# Patient Record
Sex: Male | Born: 1945 | Race: White | Hispanic: No | Marital: Married | Smoking: Former smoker
Health system: Southern US, Community
[De-identification: ages and names within clinical notes are randomized; demographics above are authoritative.]

## PROBLEM LIST (undated history)

## (undated) DIAGNOSIS — E781 Pure hyperglyceridemia: Secondary | ICD-10-CM

## (undated) DIAGNOSIS — M1990 Unspecified osteoarthritis, unspecified site: Secondary | ICD-10-CM

## (undated) DIAGNOSIS — K219 Gastro-esophageal reflux disease without esophagitis: Secondary | ICD-10-CM

## (undated) DIAGNOSIS — I10 Essential (primary) hypertension: Secondary | ICD-10-CM

## (undated) DIAGNOSIS — Z8489 Family history of other specified conditions: Secondary | ICD-10-CM

## (undated) HISTORY — DX: Essential (primary) hypertension: I10

## (undated) HISTORY — DX: Pure hyperglyceridemia: E78.1

## (undated) HISTORY — PX: POPLITEAL SYNOVIAL CYST EXCISION: SUR555

## (undated) HISTORY — PX: CARPAL TUNNEL RELEASE: SHX101

## (undated) HISTORY — DX: Unspecified osteoarthritis, unspecified site: M19.90

---

## 2001-12-09 ENCOUNTER — Ambulatory Visit (HOSPITAL_COMMUNITY): Admission: RE | Admit: 2001-12-09 | Discharge: 2001-12-09 | Payer: Self-pay | Admitting: Family Medicine

## 2001-12-09 ENCOUNTER — Encounter: Payer: Self-pay | Admitting: Family Medicine

## 2009-05-13 ENCOUNTER — Emergency Department (HOSPITAL_COMMUNITY): Admission: EM | Admit: 2009-05-13 | Discharge: 2009-05-13 | Payer: Self-pay | Admitting: Emergency Medicine

## 2009-05-13 ENCOUNTER — Emergency Department (HOSPITAL_COMMUNITY): Admission: EM | Admit: 2009-05-13 | Discharge: 2009-05-14 | Payer: Self-pay | Admitting: Emergency Medicine

## 2009-05-24 ENCOUNTER — Ambulatory Visit (HOSPITAL_COMMUNITY): Admission: RE | Admit: 2009-05-24 | Discharge: 2009-05-24 | Payer: Self-pay | Admitting: Urology

## 2009-05-25 HISTORY — PX: LITHOTRIPSY: SUR834

## 2009-06-08 ENCOUNTER — Ambulatory Visit (HOSPITAL_COMMUNITY): Admission: RE | Admit: 2009-06-08 | Discharge: 2009-06-08 | Payer: Self-pay | Admitting: Urology

## 2009-06-09 ENCOUNTER — Emergency Department (HOSPITAL_COMMUNITY): Admission: EM | Admit: 2009-06-09 | Discharge: 2009-06-10 | Payer: Self-pay | Admitting: Emergency Medicine

## 2010-09-25 LAB — DIFFERENTIAL
Basophils Absolute: 0 10*3/uL (ref 0.0–0.1)
Basophils Relative: 0 % (ref 0–1)
Eosinophils Absolute: 0 10*3/uL (ref 0.0–0.7)
Eosinophils Relative: 0 % (ref 0–5)
Lymphocytes Relative: 8 % — ABNORMAL LOW (ref 12–46)
Monocytes Absolute: 0.5 10*3/uL (ref 0.1–1.0)

## 2010-09-25 LAB — URINALYSIS, ROUTINE W REFLEX MICROSCOPIC
Bilirubin Urine: NEGATIVE
Ketones, ur: 15 mg/dL — AB
Leukocytes, UA: NEGATIVE
Nitrite: NEGATIVE
Protein, ur: NEGATIVE mg/dL
Urobilinogen, UA: 0.2 mg/dL (ref 0.0–1.0)

## 2010-09-25 LAB — BASIC METABOLIC PANEL
CO2: 24 mEq/L (ref 19–32)
Calcium: 10.2 mg/dL (ref 8.4–10.5)
GFR calc non Af Amer: 57 mL/min — ABNORMAL LOW (ref >60)
Potassium: 3.6 mEq/L (ref 3.5–5.1)
Sodium: 132 mEq/L — ABNORMAL LOW (ref 135–145)

## 2010-09-25 LAB — URINE CULTURE
Colony Count: NO GROWTH
Culture: NO GROWTH

## 2010-09-25 LAB — URINE MICROSCOPIC-ADD ON

## 2010-09-25 LAB — CBC
RBC: 5.35 MIL/uL (ref 4.22–5.81)
WBC: 12.4 10*3/uL — ABNORMAL HIGH (ref 4.0–10.5)

## 2010-09-26 LAB — BASIC METABOLIC PANEL
Calcium: 10.3 mg/dL (ref 8.4–10.5)
GFR calc non Af Amer: >60 mL/min (ref >60)
Glucose, Bld: 99 mg/dL (ref 70–99)
Potassium: 3.8 mEq/L (ref 3.5–5.1)
Sodium: 139 mEq/L (ref 135–145)

## 2010-09-27 LAB — URINE CULTURE
Colony Count: NO GROWTH
Culture: NO GROWTH

## 2010-09-27 LAB — URINALYSIS, ROUTINE W REFLEX MICROSCOPIC
Bilirubin Urine: NEGATIVE
Glucose, UA: NEGATIVE mg/dL
Ketones, ur: 40 mg/dL — AB
Leukocytes, UA: NEGATIVE
Nitrite: NEGATIVE
Protein, ur: 30 mg/dL — AB
Specific Gravity, Urine: 1.015 (ref 1.005–1.030)
Urobilinogen, UA: 0.2 mg/dL (ref 0.0–1.0)
pH: 6.5 (ref 5.0–8.0)

## 2010-09-27 LAB — BASIC METABOLIC PANEL
CO2: 23 mEq/L (ref 19–32)
Chloride: 101 mEq/L (ref 96–112)
Potassium: 3.6 mEq/L (ref 3.5–5.1)

## 2010-09-27 LAB — BASIC METABOLIC PANEL WITH GFR
BUN: 15 mg/dL (ref 6–23)
Calcium: 9.9 mg/dL (ref 8.4–10.5)
Creatinine, Ser: 1.46 mg/dL (ref 0.4–1.5)
GFR calc non Af Amer: 49 mL/min — ABNORMAL LOW (ref >60)
Glucose, Bld: 170 mg/dL — ABNORMAL HIGH (ref 70–99)
Sodium: 138 meq/L (ref 135–145)

## 2010-09-27 LAB — URINE MICROSCOPIC-ADD ON

## 2010-12-16 ENCOUNTER — Emergency Department (HOSPITAL_COMMUNITY): Payer: BC Managed Care – PPO

## 2010-12-16 ENCOUNTER — Emergency Department (HOSPITAL_COMMUNITY)
Admission: EM | Admit: 2010-12-16 | Discharge: 2010-12-16 | Disposition: A | Discharge status: Home / Self Care | Payer: BC Managed Care – PPO | Attending: Emergency Medicine | Admitting: Emergency Medicine

## 2010-12-16 DIAGNOSIS — I10 Essential (primary) hypertension: Secondary | ICD-10-CM | POA: Insufficient documentation

## 2010-12-16 DIAGNOSIS — Z79899 Other long term (current) drug therapy: Secondary | ICD-10-CM | POA: Insufficient documentation

## 2010-12-16 DIAGNOSIS — K922 Gastrointestinal hemorrhage, unspecified: Secondary | ICD-10-CM | POA: Insufficient documentation

## 2010-12-16 DIAGNOSIS — K219 Gastro-esophageal reflux disease without esophagitis: Secondary | ICD-10-CM | POA: Insufficient documentation

## 2010-12-16 DIAGNOSIS — K649 Unspecified hemorrhoids: Secondary | ICD-10-CM | POA: Insufficient documentation

## 2010-12-16 LAB — CBC
HCT: 44 % (ref 39.0–52.0)
Hemoglobin: 15.8 g/dL (ref 13.0–17.0)
MCV: 89.6 fL (ref 78.0–100.0)
Platelets: 181 10*3/uL (ref 150–400)
RBC: 4.91 MIL/uL (ref 4.22–5.81)
WBC: 6.3 10*3/uL (ref 4.0–10.5)

## 2010-12-16 LAB — HEPATIC FUNCTION PANEL
AST: 25 U/L (ref 0–37)
Albumin: 4.1 g/dL (ref 3.5–5.2)
Alkaline Phosphatase: 52 U/L (ref 39–117)
Total Protein: 7.2 g/dL (ref 6.0–8.3)

## 2010-12-16 LAB — BASIC METABOLIC PANEL
BUN: 15 mg/dL (ref 6–23)
CO2: 28 mEq/L (ref 19–32)
Chloride: 101 mEq/L (ref 96–112)
Creatinine, Ser: 1.22 mg/dL (ref 0.50–1.35)
Glucose, Bld: 106 mg/dL — ABNORMAL HIGH (ref 70–99)

## 2010-12-16 LAB — DIFFERENTIAL
Lymphocytes Relative: 23 % (ref 12–46)
Lymphs Abs: 1.4 10*3/uL (ref 0.7–4.0)
Monocytes Relative: 10 % (ref 3–12)
Neutro Abs: 4.1 10*3/uL (ref 1.7–7.7)
Neutrophils Relative %: 65 % (ref 43–77)

## 2010-12-16 LAB — PROTIME-INR: Prothrombin Time: 13.4 seconds (ref 11.6–15.2)

## 2010-12-16 LAB — LIPASE, BLOOD: Lipase: 36 U/L (ref 11–59)

## 2010-12-16 MED ORDER — IOHEXOL 350 MG/ML SOLN: 100.0000 mL | Freq: Once | INTRAVENOUS | Status: DC | PRN

## 2010-12-16 MED ORDER — IOHEXOL 300 MG/ML  SOLN: 100.0000 mL | Freq: Once | INTRAMUSCULAR | Status: DC | PRN

## 2010-12-28 NOTE — H&P (Addendum)
Adam Lewis is an 65 y.o. male.   Chief Complaint: *Need for screening TCS** HPI: *Had one episode of blood per rectum.  Seen in ER, felt to be hemorrhoidal disease.  Has since resolved.  Presents for screening TCS.  Never has had a TCS.**  No past medical history on file.HTN, reflux  No past surgical history on file.Left knee surgery  No family history on file. Social History:  does not have a smoking history on file. He does not have any smokeless tobacco history on file. His alcohol and drug histories not on file.  Allergies: Allergies not on fileNKDA  No prescriptions prior to admission  Prevacid, Lisinopril/HCTZ, baby asa, meloxicam  No results found for this or any previous visit (from the past 48 hour(s)). No results found.  Review of Systems  Constitutional: Negative.   HENT: Negative.   Eyes: Negative.   Respiratory: Negative.   Cardiovascular: Negative.   Gastrointestinal: Positive for blood in stool.  Genitourinary: Negative.   Musculoskeletal: Negative.   Skin: Negative.   Neurological: Negative.   Endo/Heme/Allergies: Negative.   Psychiatric/Behavioral: Negative.     There were no vitals taken for this visit. Physical Exam  Constitutional: He is oriented to person, place, and time. He appears well-developed and well-nourished.  HENT:  Head: Normocephalic and atraumatic.  Eyes: Pupils are equal, round, and reactive to light.  Neck: Normal range of motion. Neck supple.  Cardiovascular: Normal rate, regular rhythm and normal heart sounds.   Respiratory: Effort normal and breath sounds normal. He has no wheezes. He has no rales.  GI: Soft. Bowel sounds are normal. He exhibits no distension and no mass. There is no tenderness.  Musculoskeletal: Normal range of motion.  Lymphadenopathy:    He has no cervical adenopathy.  Neurological: He is alert and oriented to person, place, and time.  Skin: Skin is warm.  Psychiatric: He has a normal mood and affect.  His behavior is normal. Judgment and thought content normal.     Assessment/Plan *Need for screening TCS.  Scheduled for TCS on 01/02/11.**  Autumm Hattery A 12/28/2010, 1:02 PM

## 2011-01-01 MED ORDER — SODIUM CHLORIDE 0.45 % IV SOLN
Freq: Once | INTRAVENOUS | Status: AC
  Administered 2011-01-02: 07:00:00 via INTRAVENOUS

## 2011-01-02 ENCOUNTER — Encounter (HOSPITAL_COMMUNITY)
Admission: RE | Disposition: A | Discharge status: Home / Self Care | Payer: Self-pay | Source: Ambulatory Visit | Attending: General Surgery

## 2011-01-02 ENCOUNTER — Encounter (HOSPITAL_COMMUNITY): Payer: Self-pay | Admitting: *Deleted

## 2011-01-02 ENCOUNTER — Ambulatory Visit (HOSPITAL_COMMUNITY)
Admission: RE | Admit: 2011-01-02 | Discharge: 2011-01-02 | Disposition: A | Discharge status: Home / Self Care | Payer: BC Managed Care – PPO | Source: Ambulatory Visit | Attending: General Surgery | Admitting: General Surgery

## 2011-01-02 DIAGNOSIS — I10 Essential (primary) hypertension: Secondary | ICD-10-CM | POA: Insufficient documentation

## 2011-01-02 DIAGNOSIS — Z79899 Other long term (current) drug therapy: Secondary | ICD-10-CM | POA: Insufficient documentation

## 2011-01-02 DIAGNOSIS — Z1211 Encounter for screening for malignant neoplasm of colon: Secondary | ICD-10-CM | POA: Insufficient documentation

## 2011-01-02 DIAGNOSIS — Z7982 Long term (current) use of aspirin: Secondary | ICD-10-CM | POA: Insufficient documentation

## 2011-01-02 HISTORY — DX: Gastro-esophageal reflux disease without esophagitis: K21.9

## 2011-01-02 HISTORY — PX: COLONOSCOPY: SHX5424

## 2011-01-02 SURGERY — COLONOSCOPY
Anesthesia: Moderate Sedation

## 2011-01-02 MED ORDER — MEPERIDINE HCL 100 MG/ML IJ SOLN
INTRAMUSCULAR | Status: AC
  Filled 2011-01-02: qty 2

## 2011-01-02 MED ORDER — MIDAZOLAM HCL 5 MG/5ML IJ SOLN
INTRAMUSCULAR | Status: DC | PRN
  Administered 2011-01-02: 3 mg via INTRAVENOUS
  Administered 2011-01-02: 1 mg via INTRAVENOUS

## 2011-01-02 MED ORDER — MEPERIDINE HCL 25 MG/ML IJ SOLN
INTRAMUSCULAR | Status: DC | PRN
  Administered 2011-01-02: 50 mg via INTRAVENOUS

## 2011-01-02 MED ORDER — STERILE WATER FOR IRRIGATION IR SOLN
Status: DC | PRN
  Administered 2011-01-02: 08:00:00

## 2011-01-02 MED ORDER — MIDAZOLAM HCL 5 MG/5ML IJ SOLN
INTRAMUSCULAR | Status: AC
  Filled 2011-01-02: qty 10

## 2011-01-03 NOTE — Brief Op Note (Deleted)
Refer to procedures note. 

## 2011-01-03 NOTE — Op Note (Signed)
Refer to procedures note.

## 2011-01-04 ENCOUNTER — Encounter (HOSPITAL_COMMUNITY): Payer: Self-pay

## 2011-01-15 ENCOUNTER — Encounter (HOSPITAL_COMMUNITY): Payer: Self-pay | Admitting: General Surgery

## 2011-12-26 ENCOUNTER — Other Ambulatory Visit (HOSPITAL_COMMUNITY): Payer: Self-pay | Admitting: Family Medicine

## 2011-12-26 ENCOUNTER — Ambulatory Visit (HOSPITAL_COMMUNITY)
Admission: RE | Admit: 2011-12-26 | Discharge: 2011-12-26 | Disposition: A | Discharge status: Home / Self Care | Payer: Medicare Other | Source: Ambulatory Visit | Attending: Family Medicine | Admitting: Family Medicine

## 2011-12-26 DIAGNOSIS — M79609 Pain in unspecified limb: Secondary | ICD-10-CM

## 2012-10-28 ENCOUNTER — Ambulatory Visit (HOSPITAL_COMMUNITY)
Admission: RE | Admit: 2012-10-28 | Discharge: 2012-10-28 | Disposition: A | Discharge status: Home / Self Care | Payer: Medicare Other | Source: Ambulatory Visit | Attending: Family Medicine | Admitting: Family Medicine

## 2012-10-28 ENCOUNTER — Other Ambulatory Visit (HOSPITAL_COMMUNITY): Payer: Self-pay | Admitting: Family Medicine

## 2012-10-28 DIAGNOSIS — R05 Cough: Secondary | ICD-10-CM

## 2012-10-28 DIAGNOSIS — R059 Cough, unspecified: Secondary | ICD-10-CM | POA: Insufficient documentation

## 2013-05-26 ENCOUNTER — Ambulatory Visit (HOSPITAL_COMMUNITY)
Admission: RE | Admit: 2013-05-26 | Discharge: 2013-05-26 | Disposition: A | Discharge status: Home / Self Care | Payer: Medicare Other | Source: Ambulatory Visit | Attending: Orthopaedic Surgery | Admitting: Orthopaedic Surgery

## 2013-05-26 DIAGNOSIS — M25561 Pain in right knee: Secondary | ICD-10-CM

## 2013-05-26 DIAGNOSIS — M25661 Stiffness of right knee, not elsewhere classified: Secondary | ICD-10-CM

## 2013-05-26 DIAGNOSIS — M25569 Pain in unspecified knee: Secondary | ICD-10-CM | POA: Insufficient documentation

## 2013-05-26 DIAGNOSIS — I10 Essential (primary) hypertension: Secondary | ICD-10-CM | POA: Insufficient documentation

## 2013-05-26 DIAGNOSIS — IMO0001 Reserved for inherently not codable concepts without codable children: Secondary | ICD-10-CM | POA: Insufficient documentation

## 2013-05-26 DIAGNOSIS — M25669 Stiffness of unspecified knee, not elsewhere classified: Secondary | ICD-10-CM | POA: Insufficient documentation

## 2013-05-26 NOTE — Evaluation (Signed)
Physical Therapy Evaluation  Patient Details  Name: Adam Lewis MRN: [id number] Date of Birth: 1945/06/26  Today's Date: 05/26/2013 Time: 1325-1355 PT Time Calculation (min): 30 min Charges: 1 evaluation             Visit#: 1 of 6  Re-eval:   Assessment Diagnosis: Rt knee scope Next MD Visit: Dr. Jerl Santos  Authorization: Medicare    Authorization Time Period:    Authorization Visit#: 1 of 10   Past Medical History:  Past Medical History  Diagnosis Date  . Hypertension   . GERD (gastroesophageal reflux disease)   . High triglycerides    Past Surgical History:  Past Surgical History  Procedure Laterality Date  . Popliteal synovial cyst excision  30 years ago  . Lithotripsy  05/2009  . Colonoscopy  01/02/2011    Procedure: COLONOSCOPY;  Surgeon: Dalia Heading;  [facility];  Service: Gastroenterology;  Laterality: N/A;    Subjective Symptoms/Limitations Pertinent History: Pt is a 67 year old male referred to PT s/p Rt knee scope 5 weeks ago of idiopathic nature which flaired up in the summer time.  his c/co is pain and tenderness to his medial knee.  He thought his knee should have been feeling better by now.  He is having a hard time sleeping on his side.  He has difficulty walking for any amount of time.   Limitations: Standing;Walking;House hold activities How long can you walk comfortably?: 30 minutes  Pain Assessment Currently in Pain?: Yes Pain Score: 3  Pain Location: Knee (achy) Pain Orientation: Left Pain Type: Acute pain;Surgical pain  Balance Screening Balance Screen Has the patient fallen in the past 6 months: No Has the patient had a decrease in activity level because of a fear of falling? : Yes Is the patient reluctant to leave their home because of a fear of falling? : No  Prior Functioning  Prior Function Level of Independence: Independent with basic ADLs  Able to Take Stairs?: Reciprically Driving: Yes Vocation: Part time  employment Vocation Requirements: substitute teacher, used to be an IT  Comments: walking for exercise (1/2-1 mile everyday)  Cognition/Observation Observation/Other Assessments Observations: swelling to Rt knee  Sensation/Coordination/Flexibility/Functional Tests Functional Tests Functional Tests: FOTO: 45/55  Assessment RLE AROM (degrees) Right Knee Extension: 8 Right Knee Flexion: 120 RLE Strength Right Hip Flexion: 5/5 Right Hip Extension: 5/5 Right Hip ABduction: 5/5 Right Hip ADduction: 5/5 Right Knee Flexion: 5/5 Right Knee Extension: 4/5 (painful) Palpation Palpation: Rt knee spasm to popliteal muscle belly  Mobility/Balance  Ambulation/Gait Ambulation/Gait: Yes Gait Pattern: Antalgic   Exercise/Treatments Stretches Gastroc Stretch: 30 seconds;1 rep Supine Quad Sets: Right;5 reps;Limitations Quad Sets Limitations: 10 sec holds Short Arc Quad Sets: Right;5 reps Prone  Hamstring Curl: 5 reps Prone Knee Hang: 2 minutes Other Prone Exercises: TKE 5 reps  Physical Therapy Assessment and Plan PT Assessment and Plan Clinical Impression Statement: Pt is a 67 year old male referred to PT s/p Rt knee scope with impairments listed below.  After evaluation pt is most limited by signficant muscle spasms and fascial restrictions to Rt popliteal region likely causing dysfunction to his Rt knee full extension and impaired gait mechanics causing increased pain.  Pt will benefit from skilled therapeutic intervention in order to improve on the following deficits: Abnormal gait;Pain;Decreased range of motion;Impaired perceived functional ability;Increased edema Rehab Potential: Good PT Frequency: Min 2X/week PT Duration:  (3 weeks) PT Treatment/Interventions: Gait training;Stair training;Functional mobility training;Therapeutic activities;Therapeutic exercise;Balance training;Modalities;Manual techniques;Patient/family education  PT Plan: Continue with manual technqiues to  anterior and posterior knee to decrease pain.  Continue to progress open chain and move to closed chain activities as tolerated.     Goals Home Exercise Program Pt/caregiver will Perform Home Exercise Program: Independently PT Goal: Perform Home Exercise Program - Progress: Met PT Short Term Goals Time to Complete Short Term Goals: 3 weeks PT Short Term Goal 1: Pt will present with decreased fascial restrictions and muscle spasms in order to report pain less than a 3/10 at night time for improved QOL>  PT Short Term Goal 2: Pt will improve knee AROM 0-120 degrees for improved gait mechanics.  PT Short Term Goal 3: Pt will be educated on self care techniques to decrease knee pain.  PT Short Term Goal 4: Pt will improve his FOTO score to limiation less than 43%.  Problem List Patient Active Problem List   Diagnosis Date Noted  . Pain in right knee 05/26/2013  . Stiffness of right knee 05/26/2013    PT - End of Session Activity Tolerance: Patient tolerated treatment well General Behavior During Therapy: [facility] for tasks assessed/performed PT Plan of Care PT Home Exercise Plan: given PT Patient Instructions: importance of HEP and use of ice for pain control.  Consulted and Agree with Plan of Care: Patient  GP Functional Assessment Tool Used: foto: 45/55 Functional Limitation: Mobility: Walking and moving around Mobility: Walking and Moving Around Current Status (W0981): At least 40 percent but less than 60 percent impaired, limited or restricted Mobility: Walking and Moving Around Goal Status [id number]): At least 40 percent but less than 60 percent impaired, limited or restricted  Ova Meegan, MPT, ATC 05/26/2013, 4:20 PM  Physician Documentation Your signature is required to indicate approval of the treatment plan as stated above.  Please sign and either send electronically or make a copy of this report for your files and return this physician signed original.   Please mark one  1.__approve of plan  2. ___approve of plan with the following conditions.   ______________________________                                                          _____________________ Physician Signature                                                                                                             Date

## 2013-05-28 ENCOUNTER — Ambulatory Visit (HOSPITAL_COMMUNITY)
Admission: RE | Admit: 2013-05-28 | Discharge: 2013-05-28 | Disposition: A | Discharge status: Home / Self Care | Payer: Medicare Other | Source: Ambulatory Visit | Attending: *Deleted | Admitting: *Deleted

## 2013-05-28 NOTE — Progress Notes (Signed)
Physical Therapy Treatment Patient Details  Name: Adam Lewis MRN: [id number] Date of Birth: 1946-01-18  Today's Date: 05/28/2013 Time: 1651-1730 PT Time Calculation (min): 39 min Charges: Therex x 28'(1651-1719) Manual x 10'(1720-1730)  Visit#: 2 of 6   Authorization: Medicare  Authorization Visit#: 2 of 10   Subjective: Symptoms/Limitations Symptoms: Pt states that after working today his knee pain increased to 4/10. It decreased with pain meds. Pain Assessment Currently in Pain?: Yes Pain Score: 2  Pain Location: Knee Pain Orientation: Left  Exercise/Treatments Stretches Gastroc Stretch: 2 reps;30 seconds;Limitations Gastroc Stretch Limitations: Slant board Supine Quad Sets: 10 reps Quad Sets Limitations: 10" holds Short Arc [employer] Sets: 10 reps;Limitations Short Arc Quad Sets Limitations: 5" holds Terminal Knee Extension: 10 reps;Limitations Terminal Knee Extension Limitations: 5" holds Straight Leg Raises: 10 reps  Manual Therapy Manual Therapy: Myofascial release Myofascial Release: MFR to right medial and lateral knee to decrease adhesions and pain.  Physical Therapy Assessment and Plan PT Assessment and Plan Clinical Impression Statement: Treatments focus on improving quad strength/coordination and improve active extension. Pt requires multimodal cueing to improved distal quad coordination. Encouraged pt to complete quad sets at home as often as possible. manual techniques completed to medial and lateral knee to decrease adhesions and pain. Pt reports pain decrease to 1/10 at end of session. Advised pt to ice knee at home. Pt will benefit from skilled therapeutic intervention in order to improve on the following deficits: Abnormal gait;Pain;Decreased range of motion;Impaired perceived functional ability;Increased edema Rehab Potential: Good PT Frequency: Min 2X/week PT Duration:  (3 weeks) PT Treatment/Interventions: Gait training;Stair training;Functional  mobility training;Therapeutic activities;Therapeutic exercise;Balance training;Modalities;Manual techniques;Patient/family education PT Plan: Continue with manual technqiues to anterior and posterior knee to decrease pain.  Continue to progress open chain and move to closed chain activities as tolerated.     Problem List Patient Active Problem List   Diagnosis Date Noted  . Pain in right knee 05/26/2013  . Stiffness of right knee 05/26/2013    PT - End of Session Activity Tolerance: Patient tolerated treatment well General Behavior During Therapy: [facility] for tasks assessed/performed  Seth Bake, PTA  05/28/2013, 5:36 PM

## 2013-06-02 ENCOUNTER — Ambulatory Visit (HOSPITAL_COMMUNITY)
Admission: RE | Admit: 2013-06-02 | Discharge: 2013-06-02 | Disposition: A | Discharge status: Home / Self Care | Payer: Medicare Other | Source: Ambulatory Visit | Attending: *Deleted | Admitting: *Deleted

## 2013-06-02 NOTE — Progress Notes (Signed)
Physical Therapy Treatment Patient Details  Name: Adam Lewis MRN: [id number] Date of Birth: 07-Mar-1946  Today's Date: 06/02/2013 Time: 7829-5621 PT Time Calculation (min): 50 min Charges: Therex x 38'(1644-1722) Ice x 10'(1734-1734)  Visit#: 3 of 6   Authorization: Medicare  Authorization Visit#: 3 of 10   Subjective: Symptoms/Limitations Symptoms: Pt feels like he is walking better. Pain Assessment Currently in Pain?: Yes Pain Score: 2  Pain Location: Knee Pain Orientation: Right  Exercise/Treatments Stretches Gastroc Stretch: 2 reps;30 seconds;Limitations Gastroc Stretch Limitations: Slant board Standing Heel Raises: 10 reps;Limitations Heel Raises Limitations: Toe raises x 10 Knee Flexion: 10 reps Functional Squat: 10 reps Rocker Board: 2 minutes Supine Short Arc Quad Sets: 10 reps;Limitations Short Arc Quad Sets Limitations: 5" holds  Physical Therapy Assessment and Plan PT Assessment and Plan Clinical Impression Statement: Progressed to standing exercises with minimal difficulty after initial cueing and demo. Pt continues to requires multimodal cueing to improve distal quad contraction. Ice applied at end of session to limit pain and inflammation. Pt will benefit from skilled therapeutic intervention in order to improve on the following deficits: Abnormal gait;Pain;Decreased range of motion;Impaired perceived functional ability;Increased edema Rehab Potential: Good PT Plan: Continue to progress strength/stability per PT POC. Utilize manual techniques and ice as needed.     Problem List Patient Active Problem List   Diagnosis Date Noted  . Pain in right knee 05/26/2013  . Stiffness of right knee 05/26/2013    PT - End of Session Activity Tolerance: Patient tolerated treatment well General Behavior During Therapy: [facility] for tasks assessed/performed  Seth Bake, PTA 06/02/2013, 5:28 PM

## 2013-06-04 ENCOUNTER — Ambulatory Visit (HOSPITAL_COMMUNITY)
Admission: RE | Admit: 2013-06-04 | Discharge: 2013-06-04 | Disposition: A | Discharge status: Home / Self Care | Payer: Medicare Other | Source: Ambulatory Visit | Attending: *Deleted | Admitting: *Deleted

## 2013-06-04 NOTE — Progress Notes (Signed)
Physical Therapy Treatment Patient Details  Name: Adam Lewis MRN: [id number] Date of Birth: [birth year]  Today's Date: 06/04/2013 Time: 3086-5784 PT Time Calculation (min): 40 min Charges: Therex x [phone]) Ultrasound x 8'(1717-1725)  Visit#: 3 of 6   Authorization: Medicare  Authorization Visit#: 3 of 10   Subjective: Symptoms/Limitations Symptoms: Pt states that she has had increased pain since last session. Pain Assessment Currently in Pain?: Yes Pain Score: 3  Pain Location: Knee Pain Orientation: Right   Exercise/Treatments Aerobic Stationary Bike: 6'@2 .0 Supine Quad Sets: 10 reps Quad Sets Limitations: 10" holds Short Arc [name]: 10 reps Short Arc Quad Sets Limitations: 5" holds Terminal Knee Extension: 10 reps Terminal Knee Extension Limitations: 5" holds  Modalities Modalities: Ultrasound Ultrasound Ultrasound Location: Right medial knee Ultrasound Parameters: 3 MHz 1.0 w/cm2 50% duty cycle Ultrasound Goals: Pain  Physical Therapy Assessment and Plan PT Assessment and Plan Clinical Impression Statement: Standing exercises held this session secondary to increased pain. Pt displays improve distal quad contraction with TKE and quad sets. Completed non-thermal ultrasound to right medial knee to decrease pain and facilitates healing. Pt reports pain decrease to 1/10 at end of session. Pt will benefit from skilled therapeutic intervention in order to improve on the following deficits: Abnormal gait;Pain;Decreased range of motion;Impaired perceived functional ability;Increased edema Rehab Potential: Good PT Plan: Continue to progress strength/stability per PT POC. Utilize manual techniques and modalities as needed.    Problem List Patient Active Problem List   Diagnosis Date Noted  . Pain in right knee 05/26/2013  . Stiffness of right knee 05/26/2013    PT - End of Session Activity Tolerance: Patient tolerated treatment  well General Behavior During Therapy: [facility] for tasks assessed/performed  Seth Bake, PTA  06/04/2013, 5:51 PM

## 2013-06-09 ENCOUNTER — Ambulatory Visit (HOSPITAL_COMMUNITY)
Admission: RE | Admit: 2013-06-09 | Discharge: 2013-06-09 | Disposition: A | Discharge status: Home / Self Care | Payer: Medicare Other | Source: Ambulatory Visit | Attending: *Deleted | Admitting: *Deleted

## 2013-06-09 DIAGNOSIS — M25661 Stiffness of right knee, not elsewhere classified: Secondary | ICD-10-CM

## 2013-06-09 DIAGNOSIS — M25561 Pain in right knee: Secondary | ICD-10-CM

## 2013-06-09 NOTE — Progress Notes (Signed)
Physical Therapy Treatment Patient Details  Name: Adam Lewis MRN: [id number] Date of Birth: 1946/04/15  Today's Date: 06/09/2013 Time: 1478-2956 PT Time Calculation (min): 57 min Charge TE [id number], [place] 8469-6295  Visit#: 4 of 6  Re-eval:   Assessment Diagnosis: Rt knee scope Next MD Visit: Dr. Jerl Santos  Authorization: Medicare  Authorization Time Period:    Authorization Visit#: 4 of 10   Subjective: Symptoms/Limitations Symptoms: Pt states he has been hurting since last Thursday, today first day of pain.  Pt stated most difficulty with stairs, kneeling and knee extenstion How long can you sit comfortably?: 5-10 minutes Pain Assessment Currently in Pain?: Yes Pain Score: 1  Pain Location: Knee Pain Orientation: Right  Objective:   Exercise/Treatments Stretches Active Hamstring Stretch: 2 reps;30 seconds;Limitations Active Hamstring Stretch Limitations: with rope Gastroc Stretch: 3 reps;30 seconds;Limitations Gastroc Stretch Limitations: Slant board Aerobic Stationary Bike: 8' '@2 .0 Standing Heel Raises: 10 reps;Limitations Heel Raises Limitations: Toe raises x 10 Knee Flexion: 10 reps Terminal Knee Extension: Right;10 reps;Theraband Theraband Level (Terminal Knee Extension): Level 4 (Blue) Lateral Step Up: Right;10 reps;Hand Hold: 2;Step Height: 4" Forward Step Up: Right;10 reps;Hand Hold: 1;Step Height: 6" Rocker Board: 2 minutes;Limitations Rocker Board Limitations: R/L and A/P Supine Quad Sets: 10 reps Quad Sets Limitations: 10" holds Short Arc [name]: 10 reps Short Arc [name] Limitations: 5" holds Terminal Knee Extension: 10 reps Terminal Knee Extension Limitations: 5" holds Prone  Prone Knee Hang: Limitations Prone Knee Hang Limitations: 8' during [place] to hamstring insertion points   Modalities Modalities: Ultrasound Manual Therapy Manual Therapy: Myofascial release Ultrasound Ultrasound Location: Right medial knee Ultrasound  Parameters: 3 MHz 1.0 w/cm2 50% duty cycle Ultrasound Goals: Pain  Physical Therapy Assessment and Plan PT Assessment and Plan Clinical Impression Statement: Session focus on improving functional difficults pt c/o and improve knee extension.  Pt able to demonstrate appropriate technique with all exercises following min cueing for technique.  Completed US in prone knee hang position to hamstring insertion points to decrease tightness and increase knee extension.  Pt explained benefits of increaseing HEP frequency for ultimate benefits.  Pt with improve knee extension to 4 degrees extension at end of session.   PT Plan: Continue to progress strength/stability per PT POC. Utilize manual techniques and modalities as needed.    Goals Home Exercise Program Pt/caregiver will Perform Home Exercise Program: Independently PT Short Term Goals Time to Complete Short Term Goals: 3 weeks PT Short Term Goal 1: Pt will present with decreased fascial restrictions and muscle spasms in order to report pain less than a 3/10 at night time for improved QOL>  PT Short Term Goal 2: Pt will improve knee AROM 0-120 degrees for improved gait mechanics.  PT Short Term Goal 2 - Progress: Progressing toward goal PT Short Term Goal 3: Pt will be educated on self care techniques to decrease knee pain.  PT Short Term Goal 3 - Progress: Progressing toward goal PT Short Term Goal 4: Pt will improve his FOTO score to limiation less than 43%.  Problem List Patient Active Problem List   Diagnosis Date Noted  . Pain in right knee 05/26/2013  . Stiffness of right knee 05/26/2013    PT - End of Session Activity Tolerance: Patient tolerated treatment well General Behavior During Therapy: [facility] for tasks assessed/performed  GP    Adam Lewis 06/09/2013, 5:58 PM

## 2013-06-11 ENCOUNTER — Ambulatory Visit (HOSPITAL_COMMUNITY)
Admission: RE | Admit: 2013-06-11 | Discharge: 2013-06-11 | Disposition: A | Discharge status: Home / Self Care | Payer: Medicare Other | Source: Ambulatory Visit | Attending: *Deleted | Admitting: *Deleted

## 2013-06-11 DIAGNOSIS — M25561 Pain in right knee: Secondary | ICD-10-CM

## 2013-06-11 DIAGNOSIS — M25661 Stiffness of right knee, not elsewhere classified: Secondary | ICD-10-CM

## 2013-06-11 NOTE — Evaluation (Signed)
Physical Therapy Discharge Summary  Patient Details  Name: Adam Lewis MRN: [id number] Date of Birth: December 20, 1945  Today's Date: 06/11/2013 Time: 0865-7846 PT Time Calculation (min): 46 min Charges: MMT/ ROMM x 9(6295-2841) Self care x 18' (1657-1715)              Visit#: 5 of 6  Assessment Diagnosis: Rt knee scope Next MD Visit: Dr. Jerl Santos  Authorization: Medicare    Authorization Visit#: 5 of 10   Past Medical History:  Past Medical History  Diagnosis Date  . Hypertension   . GERD (gastroesophageal reflux disease)   . High triglycerides    Past Surgical History:  Past Surgical History  Procedure Laterality Date  . Popliteal synovial cyst excision  30 years ago  . Lithotripsy  05/2009  . Colonoscopy  01/02/2011    Procedure: COLONOSCOPY;  Surgeon: Dalia Heading;  Location: [facility];  Service: Gastroenterology;  Laterality: N/A;    Subjective Symptoms/Limitations Symptoms: Pt stated he is pain free, feels his walking has improved.  Wants today to be last day of therapy due to the holiday season.   Pain Assessment Currently in Pain?: No/denies  Sensation/Coordination/Flexibility/Functional Tests Functional Tests Functional Tests: FOTO: 71 (was 45 (05/26/13))  Assessment RLE AROM (degrees) Right Knee Extension: 3 (was 8 (05/26/13)) Right Knee Flexion: 123 (was 120 (05/26/13)) RLE Strength Right Hip Flexion: 5/5 (was 5/5 (05/26/13)) Right Hip Extension: 5/5 (was 5/5 (05/26/13)) Right Hip ABduction: 5/5 (was 5/5 (05/26/13)) Right Hip ADduction: 5/5 (was 5/5 (05/26/13)) Right Knee Flexion: 5/5 (was 5/5 (05/26/13)) Right Knee Extension: 5/5 (was 4/5 (05/26/13))  Exercise/Treatments Aerobic Stationary Bike: 10' @ 2.0 for ROM  Physical Therapy Assessment and Plan PT Assessment and Plan Clinical Impression Statement: Pt displays significant improvements in strength and ROM. Pt is lacking 3 degrees of active extension. Pt has met or is progressing toward all  goals. Pt wishes to D/C to HEP. PT Plan: Recommend D/C to HEP.    Goals Home Exercise Program Pt/caregiver will Perform Home Exercise Program: Independently PT Short Term Goals Time to Complete Short Term Goals: 3 weeks PT Short Term Goal 1: Pt will present with decreased fascial restrictions and muscle spasms in order to report pain less than a 3/10 at night time for improved QOL. PT Short Term Goal 1 - Progress: Met PT Short Term Goal 2: Pt will improve knee AROM 0-120 degrees for improved gait mechanics.  PT Short Term Goal 2 - Progress: Progressing toward goal PT Short Term Goal 3: Pt will be educated on self care techniques to decrease knee pain.  PT Short Term Goal 3 - Progress: Met PT Short Term Goal 4: Pt will improve his FOTO score to limiation less than 43%. (29% limitation)  Problem List Patient Active Problem List   Diagnosis Date Noted  . Pain in right knee 05/26/2013  . Stiffness of right knee 05/26/2013    PT - End of Session Activity Tolerance: Patient tolerated treatment well General Behavior During Therapy: [facility] for tasks assessed/performed  GP Functional Assessment Tool Used: foto:71/29 Functional Limitation: Mobility: Walking and moving around Mobility: Walking and Moving Around Goal Status [phone]): At least 40 percent but less than 60 percent impaired, limited or restricted Mobility: Walking and Moving Around Discharge Status [phone]): At least 20 percent but less than 40 percent impaired, limited or restricted  Seth Bake, PTA; Annett Fabian, MPT, ATC  06/11/2013, 5:27 PM  Physician Documentation Your signature is required to indicate approval of the treatment  plan as stated above.  Please sign and either send electronically or make a copy of this report for your files and return this physician signed original.   Please mark one 1.__approve of plan  2. ___approve of plan with the following conditions.   ______________________________                                                           _____________________ Physician Signature                                                                                                             Date

## 2013-06-16 ENCOUNTER — Ambulatory Visit (HOSPITAL_COMMUNITY): Payer: Medicare Other | Admitting: *Deleted

## 2013-06-19 ENCOUNTER — Ambulatory Visit (HOSPITAL_COMMUNITY): Payer: Medicare Other

## 2013-06-23 ENCOUNTER — Ambulatory Visit (HOSPITAL_COMMUNITY): Payer: Medicare Other | Admitting: *Deleted

## 2015-02-15 ENCOUNTER — Other Ambulatory Visit: Payer: Self-pay | Admitting: Orthopaedic Surgery

## 2015-02-15 DIAGNOSIS — M25562 Pain in left knee: Secondary | ICD-10-CM

## 2015-02-23 ENCOUNTER — Ambulatory Visit
Admission: RE | Admit: 2015-02-23 | Discharge: 2015-02-23 | Disposition: A | Discharge status: Home / Self Care | Payer: PPO | Source: Ambulatory Visit | Attending: Orthopaedic Surgery | Admitting: Orthopaedic Surgery

## 2015-02-23 DIAGNOSIS — M25562 Pain in left knee: Secondary | ICD-10-CM

## 2015-09-08 DIAGNOSIS — Z1389 Encounter for screening for other disorder: Secondary | ICD-10-CM | POA: Diagnosis not present

## 2015-09-08 DIAGNOSIS — K219 Gastro-esophageal reflux disease without esophagitis: Secondary | ICD-10-CM | POA: Diagnosis not present

## 2015-09-08 DIAGNOSIS — Z6831 Body mass index (BMI) 31.0-31.9, adult: Secondary | ICD-10-CM | POA: Diagnosis not present

## 2015-09-08 DIAGNOSIS — M792 Neuralgia and neuritis, unspecified: Secondary | ICD-10-CM | POA: Diagnosis not present

## 2015-09-08 DIAGNOSIS — I10 Essential (primary) hypertension: Secondary | ICD-10-CM | POA: Diagnosis not present

## 2015-09-08 DIAGNOSIS — E781 Pure hyperglyceridemia: Secondary | ICD-10-CM | POA: Diagnosis not present

## 2015-09-08 DIAGNOSIS — E6609 Other obesity due to excess calories: Secondary | ICD-10-CM | POA: Diagnosis not present

## 2015-09-08 DIAGNOSIS — Z0000 Encounter for general adult medical examination without abnormal findings: Secondary | ICD-10-CM | POA: Diagnosis not present

## 2015-09-08 DIAGNOSIS — M109 Gout, unspecified: Secondary | ICD-10-CM | POA: Diagnosis not present

## 2015-12-26 DIAGNOSIS — M1711 Unilateral primary osteoarthritis, right knee: Secondary | ICD-10-CM | POA: Diagnosis not present

## 2015-12-26 DIAGNOSIS — M1712 Unilateral primary osteoarthritis, left knee: Secondary | ICD-10-CM | POA: Diagnosis not present

## 2016-02-22 DIAGNOSIS — L723 Sebaceous cyst: Secondary | ICD-10-CM | POA: Diagnosis not present

## 2016-04-09 DIAGNOSIS — Z23 Encounter for immunization: Secondary | ICD-10-CM | POA: Diagnosis not present

## 2016-09-13 DIAGNOSIS — Z6830 Body mass index (BMI) 30.0-30.9, adult: Secondary | ICD-10-CM | POA: Diagnosis not present

## 2016-09-13 DIAGNOSIS — Z0000 Encounter for general adult medical examination without abnormal findings: Secondary | ICD-10-CM | POA: Diagnosis not present

## 2016-09-13 DIAGNOSIS — E669 Obesity, unspecified: Secondary | ICD-10-CM | POA: Diagnosis not present

## 2016-09-13 DIAGNOSIS — I10 Essential (primary) hypertension: Secondary | ICD-10-CM | POA: Diagnosis not present

## 2016-09-13 DIAGNOSIS — E782 Mixed hyperlipidemia: Secondary | ICD-10-CM | POA: Diagnosis not present

## 2016-09-13 DIAGNOSIS — E6609 Other obesity due to excess calories: Secondary | ICD-10-CM | POA: Diagnosis not present

## 2016-09-21 DIAGNOSIS — Z0000 Encounter for general adult medical examination without abnormal findings: Secondary | ICD-10-CM | POA: Diagnosis not present

## 2016-09-21 DIAGNOSIS — E782 Mixed hyperlipidemia: Secondary | ICD-10-CM | POA: Diagnosis not present

## 2016-09-21 DIAGNOSIS — I10 Essential (primary) hypertension: Secondary | ICD-10-CM | POA: Diagnosis not present

## 2016-10-01 DIAGNOSIS — M1712 Unilateral primary osteoarthritis, left knee: Secondary | ICD-10-CM | POA: Diagnosis not present

## 2016-10-01 DIAGNOSIS — M1711 Unilateral primary osteoarthritis, right knee: Secondary | ICD-10-CM | POA: Diagnosis not present

## 2016-10-08 DIAGNOSIS — M1712 Unilateral primary osteoarthritis, left knee: Secondary | ICD-10-CM | POA: Diagnosis not present

## 2016-10-15 DIAGNOSIS — M1712 Unilateral primary osteoarthritis, left knee: Secondary | ICD-10-CM | POA: Diagnosis not present

## 2016-10-22 DIAGNOSIS — M1712 Unilateral primary osteoarthritis, left knee: Secondary | ICD-10-CM | POA: Diagnosis not present

## 2016-10-29 DIAGNOSIS — M1712 Unilateral primary osteoarthritis, left knee: Secondary | ICD-10-CM | POA: Diagnosis not present

## 2016-11-28 DIAGNOSIS — H524 Presbyopia: Secondary | ICD-10-CM | POA: Diagnosis not present

## 2016-11-28 DIAGNOSIS — H52203 Unspecified astigmatism, bilateral: Secondary | ICD-10-CM | POA: Diagnosis not present

## 2016-11-28 DIAGNOSIS — H43813 Vitreous degeneration, bilateral: Secondary | ICD-10-CM | POA: Diagnosis not present

## 2016-11-28 DIAGNOSIS — H5213 Myopia, bilateral: Secondary | ICD-10-CM | POA: Diagnosis not present

## 2016-11-28 DIAGNOSIS — H2513 Age-related nuclear cataract, bilateral: Secondary | ICD-10-CM | POA: Diagnosis not present

## 2017-03-08 DIAGNOSIS — M1712 Unilateral primary osteoarthritis, left knee: Secondary | ICD-10-CM | POA: Diagnosis not present

## 2017-04-22 DIAGNOSIS — G5601 Carpal tunnel syndrome, right upper limb: Secondary | ICD-10-CM | POA: Diagnosis not present

## 2017-04-23 DIAGNOSIS — Z23 Encounter for immunization: Secondary | ICD-10-CM | POA: Diagnosis not present

## 2017-04-26 DIAGNOSIS — G5601 Carpal tunnel syndrome, right upper limb: Secondary | ICD-10-CM | POA: Diagnosis not present

## 2017-05-29 DIAGNOSIS — M1712 Unilateral primary osteoarthritis, left knee: Secondary | ICD-10-CM | POA: Diagnosis not present

## 2017-05-29 DIAGNOSIS — M1711 Unilateral primary osteoarthritis, right knee: Secondary | ICD-10-CM | POA: Diagnosis not present

## 2017-06-05 DIAGNOSIS — M1711 Unilateral primary osteoarthritis, right knee: Secondary | ICD-10-CM | POA: Diagnosis not present

## 2017-06-05 DIAGNOSIS — M1712 Unilateral primary osteoarthritis, left knee: Secondary | ICD-10-CM | POA: Diagnosis not present

## 2017-06-12 DIAGNOSIS — M1712 Unilateral primary osteoarthritis, left knee: Secondary | ICD-10-CM | POA: Diagnosis not present

## 2017-06-12 DIAGNOSIS — M1711 Unilateral primary osteoarthritis, right knee: Secondary | ICD-10-CM | POA: Diagnosis not present

## 2017-06-12 DIAGNOSIS — G5601 Carpal tunnel syndrome, right upper limb: Secondary | ICD-10-CM | POA: Diagnosis not present

## 2017-09-11 DIAGNOSIS — E782 Mixed hyperlipidemia: Secondary | ICD-10-CM | POA: Diagnosis not present

## 2017-09-11 DIAGNOSIS — I10 Essential (primary) hypertension: Secondary | ICD-10-CM | POA: Diagnosis not present

## 2017-09-17 DIAGNOSIS — E782 Mixed hyperlipidemia: Secondary | ICD-10-CM | POA: Diagnosis not present

## 2017-09-17 DIAGNOSIS — G629 Polyneuropathy, unspecified: Secondary | ICD-10-CM | POA: Diagnosis not present

## 2017-09-17 DIAGNOSIS — K219 Gastro-esophageal reflux disease without esophagitis: Secondary | ICD-10-CM | POA: Diagnosis not present

## 2017-09-17 DIAGNOSIS — Z1389 Encounter for screening for other disorder: Secondary | ICD-10-CM | POA: Diagnosis not present

## 2017-09-17 DIAGNOSIS — Z0001 Encounter for general adult medical examination with abnormal findings: Secondary | ICD-10-CM | POA: Diagnosis not present

## 2017-09-17 DIAGNOSIS — Z6830 Body mass index (BMI) 30.0-30.9, adult: Secondary | ICD-10-CM | POA: Diagnosis not present

## 2017-09-17 DIAGNOSIS — G64 Other disorders of peripheral nervous system: Secondary | ICD-10-CM | POA: Diagnosis not present

## 2017-09-17 DIAGNOSIS — E6609 Other obesity due to excess calories: Secondary | ICD-10-CM | POA: Diagnosis not present

## 2017-09-17 DIAGNOSIS — M159 Polyosteoarthritis, unspecified: Secondary | ICD-10-CM | POA: Diagnosis not present

## 2017-09-17 DIAGNOSIS — G5601 Carpal tunnel syndrome, right upper limb: Secondary | ICD-10-CM | POA: Diagnosis not present

## 2017-09-17 DIAGNOSIS — I10 Essential (primary) hypertension: Secondary | ICD-10-CM | POA: Diagnosis not present

## 2017-09-17 DIAGNOSIS — R7309 Other abnormal glucose: Secondary | ICD-10-CM | POA: Diagnosis not present

## 2017-10-22 ENCOUNTER — Encounter: Payer: Self-pay | Admitting: Cardiology

## 2017-10-22 NOTE — Progress Notes (Signed)
Cardiology Office Note  Date: 10/24/2017   ID: Adam Lewis, DOB March 13, 1946, MRN [id number]  PCP: Redmond School, MD  Consulting Cardiologist: Rozann Lesches, MD   Chief Complaint  Patient presents with  . Exertional chest discomfort    History of Present Illness: Adam Lewis is a 72 y.o. male referred for cardiology consultation by Dr. Gerarda Fraction for cardiac evaluation.  He states that since returning from a trip to [place] with his wife last September he has been noticing episodes of exertional chest tightness when he walks his dog.  This is been gradually progressive, he still walks his dog 3 times a day, but has to stop more frequently due to the chest tightness.  He has had no asymmetric leg swelling, no cough or hemoptysis.  He reports undergoing a stress test approximately 20 years ago for screening, does not recall any abnormalities or interval diagnosis of CAD.  His father and grandfather both had heart disease.  Has a history of hypertension, blood pressure is well controlled today on Cozaar.  Also mixed hyperlipidemia currently treated with Pravachol and omega-3 supplements.  I personally reviewed his ECG today which shows sinus rhythm with poor R wave progression.  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  Past Medical History:  Diagnosis Date  . Essential hypertension   . GERD (gastroesophageal reflux disease)   . Hypertriglyceridemia   . Osteoarthritis     Past Surgical History:  Procedure Laterality Date  . COLONOSCOPY  01/02/2011   Procedure: COLONOSCOPY;  Surgeon: Jamesetta So;  [facility];  Service: Gastroenterology;  Laterality: N/A;  . LITHOTRIPSY  05/2009  . 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 allopurinol (ZYLOPRIM) 100 MG tablet Take 100 mg by mouth daily.   3  . aspirin 81 MG tablet Take 81 mg by mouth daily.      . fish oil-omega-3  fatty acids 1000 MG capsule Take 2 g by mouth daily.      . lansoprazole (PREVACID) 30 MG capsule Take 30 mg by mouth daily.      Marland Kitchen losartan (COZAAR) 100 MG tablet Take 100 mg by mouth daily.     . pravastatin (PRAVACHOL) 80 MG tablet Take 80 mg by mouth daily.   3   No current facility-administered medications for this visit.    Allergies:  Patient has no known allergies.   Social History: The patient  reports that he has quit smoking. His smoking use included cigarettes. He has never used smokeless tobacco. He reports that he drinks about 9.6 oz of alcohol per week. He reports that he does not use drugs.   Family History: The patient's family history includes Heart disease in his father; Hypertension in his father.   ROS:  Please see the history of present illness. Otherwise, complete review of systems is positive for none.  All other systems are reviewed and negative.   Physical Exam: VS:  BP 126/76 (BP Location: Left Arm)   Pulse 74   Ht 5\' 10"  (1.778 m)   Wt 207 lb (93.9 kg)   SpO2 94%   BMI 29.70 kg/m , BMI Body mass index is 29.7 kg/m.  Wt Readings from Last 3 Encounters:  10/24/17 207 lb (93.9 kg)  01/02/11 208 lb (94.3 kg)    General: Patient appears comfortable at rest. HEENT: Conjunctiva and lids normal, oropharynx clear. Neck: Supple,  no elevated JVP or carotid bruits, no thyromegaly. Lungs: Clear to auscultation, nonlabored breathing at rest. Cardiac: Regular rate and rhythm, no S3 or significant systolic murmur, no pericardial rub. Abdomen: Soft, nontender, bowel sounds present, no guarding or rebound. Extremities: No pitting edema, distal pulses 2+. Skin: Warm and dry. Musculoskeletal: No kyphosis. Neuropsychiatric: Alert and oriented x3, affect grossly appropriate.  ECG: There is no old tracing available for comparison today.  Recent Labwork:  March 2019: Cholesterol 156, HDL 25, LDL 111, triglycerides 100  Assessment and Plan:  1.  Exertional chest  tightness concerning for angina, noted over the last 6 months and with somewhat progressive pattern.  Cardiac risk factors include age and gender, family history in his father, hypertension, and hyperlipidemia.  He has a more remote history of tobacco use.  ECG is abnormal but nonspecific.  Today we discussed further cardiac diagnostic options including noninvasive stress testing versus a diagnostic cardiac catheterization in light of at least an intermediate pretest probability.  We discussed the risks and benefits of both.  He would like to talk with his family before he makes a final decision.  He will call us back and we can proceed from there.  2.  Essential hypertension, blood pressure well controlled today on Cozaar.  3.  Mixed hyperlipidemia, on Pravachol and omega-3 supplements.  LDL was 111 in March.  He follows with Dr. Gerarda Fraction.  4.  Tobacco abuse in remission.  Current medicines were reviewed with the patient today.   Orders Placed This Encounter  Procedures  . EKG 12-Lead    Disposition:Patient to call back with decision regarding further cardiac testing.  Signed, Satira Sark, MD, [facility] 10/24/2017 10:10 AM    [place] at [facility] [address], [place], [place] [address] Phone: [phone]; Fax: [phone]

## 2017-10-24 ENCOUNTER — Ambulatory Visit: Payer: PPO | Admitting: Cardiology

## 2017-10-24 ENCOUNTER — Encounter: Payer: Self-pay | Admitting: *Deleted

## 2017-10-24 ENCOUNTER — Encounter: Payer: Self-pay | Admitting: Cardiology

## 2017-10-24 VITALS — BP 126/76 | HR 74 | Ht 70.0 in | Wt 207.0 lb

## 2017-10-24 DIAGNOSIS — F17201 Nicotine dependence, unspecified, in remission: Secondary | ICD-10-CM

## 2017-10-24 DIAGNOSIS — I10 Essential (primary) hypertension: Secondary | ICD-10-CM

## 2017-10-24 DIAGNOSIS — Z8249 Family history of ischemic heart disease and other diseases of the circulatory system: Secondary | ICD-10-CM | POA: Diagnosis not present

## 2017-10-24 DIAGNOSIS — I208 Other forms of angina pectoris: Secondary | ICD-10-CM

## 2017-10-24 DIAGNOSIS — E782 Mixed hyperlipidemia: Secondary | ICD-10-CM

## 2017-10-24 DIAGNOSIS — R9431 Abnormal electrocardiogram [ECG] [EKG]: Secondary | ICD-10-CM | POA: Diagnosis not present

## 2017-10-24 NOTE — Patient Instructions (Addendum)
Your physician wants you to follow-up in:  To be determined with Dr.McDowell    Please call us after you have spoken with your family.    Your physician recommends that you continue on your current medications as directed. Please refer to the Current Medication list given to you today.      Thank you for choosing [facility] !

## 2017-12-11 DIAGNOSIS — G5601 Carpal tunnel syndrome, right upper limb: Secondary | ICD-10-CM | POA: Diagnosis not present

## 2017-12-13 ENCOUNTER — Telehealth: Payer: Self-pay | Admitting: Cardiology

## 2017-12-13 DIAGNOSIS — I208 Other forms of angina pectoris: Secondary | ICD-10-CM

## 2017-12-13 DIAGNOSIS — G5601 Carpal tunnel syndrome, right upper limb: Secondary | ICD-10-CM | POA: Diagnosis not present

## 2017-12-13 NOTE — Telephone Encounter (Signed)
Dr McDowell's office notes states noninvasive stress testing.Which type of test shall I order ?Marland Kitchen Patient states he can walk on a treadmill.I told I would touch base with him on Monday 6/24, after Dr.Mcdowell reviews.

## 2017-12-14 NOTE — Telephone Encounter (Signed)
If able to walk on the Treadmill, would recommend Treadmill Myoview. We can convert to a Lexiscan if unable to achieve target HR.   Signed, Erma Heritage, PA-C 12/14/2017, 9:41 AM

## 2017-12-16 NOTE — Telephone Encounter (Signed)
Order placed for stress myoview placed, pt called  apt and instructions

## 2017-12-17 ENCOUNTER — Encounter (HOSPITAL_COMMUNITY)
Admission: RE | Admit: 2017-12-17 | Discharge: 2017-12-17 | Disposition: A | Discharge status: Home / Self Care | Payer: PPO | Source: Ambulatory Visit | Attending: Student | Admitting: Student

## 2017-12-17 ENCOUNTER — Ambulatory Visit (HOSPITAL_COMMUNITY)
Admission: RE | Admit: 2017-12-17 | Discharge: 2017-12-17 | Disposition: A | Discharge status: Home / Self Care | Payer: PPO | Source: Ambulatory Visit | Attending: Student | Admitting: Student

## 2017-12-17 ENCOUNTER — Encounter (HOSPITAL_COMMUNITY): Payer: Self-pay

## 2017-12-17 DIAGNOSIS — I208 Other forms of angina pectoris: Secondary | ICD-10-CM | POA: Insufficient documentation

## 2017-12-17 LAB — NM MYOCAR MULTI W/SPECT W/WALL MOTION / EF
CHL CUP NUCLEAR SDS: 2
CHL CUP NUCLEAR SRS: 1
CHL CUP NUCLEAR SSS: 3
CSEPED: 7 min
CSEPEW: 7 METS
CSEPPHR: 129 {beats}/min
Exercise duration (sec): 20 s
LHR: 0.45
LV dias vol: 159 mL (ref 62–150)
LVSYSVOL: 86 mL
MPHR: 149 {beats}/min
NUC STRESS TID: 0.95
Percent HR: 86 %
RPE: 14
Rest HR: 68 {beats}/min

## 2017-12-17 MED ORDER — REGADENOSON 0.4 MG/5ML IV SOLN
INTRAVENOUS | Status: AC
  Filled 2017-12-17: qty 5

## 2017-12-17 MED ORDER — TECHNETIUM TC 99M TETROFOSMIN IV KIT
30.0000 | PACK | Freq: Once | INTRAVENOUS | Status: AC | PRN
  Administered 2017-12-17: 30 via INTRAVENOUS

## 2017-12-17 MED ORDER — SODIUM CHLORIDE 0.9% FLUSH
INTRAVENOUS | Status: AC
  Filled 2017-12-17: qty 160

## 2017-12-17 MED ORDER — TECHNETIUM TC 99M TETROFOSMIN IV KIT
10.0000 | PACK | Freq: Once | INTRAVENOUS | Status: AC | PRN
  Administered 2017-12-17: 10.1 via INTRAVENOUS

## 2017-12-17 MED ORDER — SODIUM CHLORIDE 0.9% FLUSH
INTRAVENOUS | Status: AC
  Administered 2017-12-17: 10 mL via INTRAVENOUS
  Filled 2017-12-17: qty 10

## 2017-12-18 ENCOUNTER — Other Ambulatory Visit: Payer: Self-pay | Admitting: Student

## 2017-12-18 ENCOUNTER — Telehealth: Payer: Self-pay | Admitting: Student

## 2017-12-18 MED ORDER — NITROGLYCERIN 0.4 MG SL SUBL: 0.4000 mg | SUBLINGUAL_TABLET | SUBLINGUAL | 3 refills | Status: AC | PRN

## 2017-12-18 NOTE — Telephone Encounter (Signed)
   I reviewed the stress test results with Dr. Domenic Polite and he recommended a cardiac catheterization for definitive evaluation. I personally called the patient today to review his stress test results and he is currently packing for a 6-week trip to [place] and says there is no way he can have a cardiac catheterization prior to leaving for the trip and he cannot delay the trip. I strongly advised that he consider having a catheterization prior to this but he declined. Therefore, we did review what anginal symptoms to watch for and to have a low threshold to seek further evaluation while away. Did send an Rx for SL NTG and reviewed how to utilize the medication. He will return in mid August and a follow-up visit has been arranged at that time to further discuss a cardiac catheterization. He was appreciative of the call.   Signed,  Erma Heritage, PA-C  12/18/2017, 4:56 PM  Pager: [phone]

## 2018-02-12 NOTE — Progress Notes (Signed)
Cardiology Office Note  Date: 02/13/2018   ID: Adam Lewis, Adam Lewis 1945/07/25, MRN [id number]  PCP: Redmond School, MD  Primary Cardiologist: Rozann Lesches, MD   Chief Complaint  Patient presents with  . Cardiac follow-up    History of Present Illness: Adam Lewis is a 72 y.o. male that I saw in consultation in May.  He was evaluated at that time with exertional chest tightness and generally decreased stamina.  Exercise Myoview from June was abnormal as detailed below.  Study was reviewed by Ms. Strader PA-C and cardiac catheterization was discussed as a next step.  Patient declined pursuing cardiac catheterization due to a 6-week trip to [place] which he could not delay.  He was continued on medical therapy including the addition of as needed nitroglycerin.  He presents today having returned from [place].  He states that he has not been as active, his dog has had some health problems and he has not been walking as regularly.  He does not report progressive exertional symptoms but they have not resolved.  Today we discussed the results of his exercise Myoview in detail.  We discussed diagnostic cardiac catheterization as a means of further clarification of his coronary anatomy and determination of any potential revascularization options.  We reviewed the risks and benefits, and he is in agreement to proceed.  I went over his current medications.  He reports compliance.  We discussed obtaining interval lab work as well as a chest x-ray.  Past Medical History:  Diagnosis Date  . Essential hypertension   . GERD (gastroesophageal reflux disease)   . Hypertriglyceridemia   . Osteoarthritis     Past Surgical History:  Procedure Laterality Date  . COLONOSCOPY  01/02/2011   Procedure: COLONOSCOPY;  Surgeon: Jamesetta So;  [facility];  Service: Gastroenterology;  Laterality: N/A;  . LITHOTRIPSY  05/2009  . 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 allopurinol (ZYLOPRIM) 100 MG tablet Take 100 mg by mouth daily.   3  . aspirin 81 MG tablet Take 81 mg by mouth daily.      . fish oil-omega-3 fatty acids 1000 MG capsule Take 2 g by mouth daily.      . lansoprazole (PREVACID) 30 MG capsule Take 30 mg by mouth daily.      Marland Kitchen losartan (COZAAR) 100 MG tablet Take 100 mg by mouth daily.     . nitroGLYCERIN (NITROSTAT) 0.4 MG SL tablet Place 1 tablet (0.4 mg total) under the tongue every 5 (five) minutes as needed for chest pain. 25 tablet 3  . pravastatin (PRAVACHOL) 80 MG tablet Take 80 mg by mouth daily.   3   No current facility-administered medications for this visit.    Allergies:  Patient has no known allergies.   Social History: The patient  reports that he quit smoking about 40 years ago. His smoking use included cigarettes. He has never used smokeless tobacco. He reports that he drinks about 16.0 standard drinks of alcohol per week. He reports that he does not use drugs.   Family History: The patient's family history includes Heart disease in his father; Hypertension in his father.   ROS:  Please see the history of present illness. Otherwise, complete review of systems is positive for none.  All other systems are reviewed and negative.   Physical Exam: VS:  BP 120/70 (BP Location:  Left Arm)   Pulse 82   Ht 5\' 9"  (1.753 m)   Wt 197 lb (89.4 kg)   SpO2 96%   BMI 29.09 kg/m , BMI Body mass index is 29.09 kg/m.  Wt Readings from Last 3 Encounters:  02/13/18 197 lb (89.4 kg)  10/24/17 207 lb (93.9 kg)  01/02/11 208 lb (94.3 kg)    General: Patient appears comfortable at rest. HEENT: Conjunctiva and lids normal, oropharynx clear. Neck: Supple, no elevated JVP or carotid bruits, no thyromegaly. Lungs: Clear to auscultation, nonlabored breathing at rest. Cardiac: Regular rate and rhythm, no S3 or significant systolic murmur. Abdomen: Soft, nontender, bowel  sounds present, no guarding or rebound. Extremities: No pitting edema, distal pulses 2+. Skin: Warm and dry. Musculoskeletal: No kyphosis. Neuropsychiatric: Alert and oriented x3, affect grossly appropriate.  ECG: I personally reviewed the tracing from 10/24/2017 which showed sinus rhythm with poor R wave progression.  Recent Labwork:  March 2019: Cholesterol 156, HDL 25, LDL 111, triglycerides 100  Other Studies Reviewed Today:  Exercise Myoview 12/17/2017:  Blood pressure demonstrated a normal response to exercise.  Horizontal ST segment depression ST segment depression of 3 mm was noted during stress in the II, III, aVF and V5/V6 leads. 9mm ST elevation aVR. No symptoms with exercise.  Duke treadmill score is -8, suggesting intermediate risk based on Duke score alone  The left ventricular ejection fraction is mildly decreased (46%).  Mixed findings for risk assessment. High risk EKG changes with diffuse ST depressions and aVR ST elevation with exercise. Moderate risk Duke treadmill score. Mild LV systolic dysfunction. Imaging limited by severe gut radiotracer uptake, but would suggest only mild apical ischemia  If high concern for CAD, consider further testing to better clarify risk. Consider coronary CTA or invasive catheterization.  Assessment and Plan:  1.  History of exertional chest tightness and decreased stamina with exercise Myoview abnormal and concerning for underlying ischemic heart disease as detailed above.  We discussed proceeding to a diagnostic cardiac catheterization for clarification of coronary anatomy and determination of any potential revascularization options.  We reviewed the risks and benefits and he is in agreement to proceed.  Baseline lab work and chest x-ray will be obtained.  2.  Essential hypertension, he is on Cozaar, blood pressure is normal today.  3.  Hyperlipidemia on Pravachol, he follows with Dr. Gerarda Fraction.  LDL was 111 in March.  Anticipate further  medication adjustments particularly if CAD is diagnosed.  4.  Tobacco abuse in remission.  Current medicines were reviewed with the patient today.   Orders Placed This Encounter  Procedures  . DG Chest 2 View  . CBC w/Diff/Platelet  . Basic Metabolic Panel (BMET)    Disposition: Follow-up after procedure.  Signed, Satira Sark, MD, [facility] 02/13/2018 10:24 AM    [place] [facility] [place]. [address], [place], [place] [address] Phone: [phone]; Fax: [phone]

## 2018-02-12 NOTE — H&P (View-Only) (Signed)
Cardiology Office Note  Date: 02/13/2018   ID: Adam Lewis, Adam Lewis [birth year], MRN [id number]  PCP: Redmond School, MD  Primary Cardiologist: Rozann Lesches, MD   Chief Complaint  Patient presents with  . Cardiac follow-up    History of Present Illness: Adam Lewis is a 72 y.o. male that I saw in consultation in May.  He was evaluated at that time with exertional chest tightness and generally decreased stamina.  Exercise Myoview from June was abnormal as detailed below.  Study was reviewed by Ms. Strader PA-C and cardiac catheterization was discussed as a next step.  Patient declined pursuing cardiac catheterization due to a 6-week trip to [place] which he could not delay.  He was continued on medical therapy including the addition of as needed nitroglycerin.  He presents today having returned from [place].  He states that he has not been as active, his dog has had some health problems and he has not been walking as regularly.  He does not report progressive exertional symptoms but they have not resolved.  Today we discussed the results of his exercise Myoview in detail.  We discussed diagnostic cardiac catheterization as a means of further clarification of his coronary anatomy and determination of any potential revascularization options.  We reviewed the risks and benefits, and he is in agreement to proceed.  I went over his current medications.  He reports compliance.  We discussed obtaining interval lab work as well as a chest x-ray.  Past Medical History:  Diagnosis Date  . Essential hypertension   . GERD (gastroesophageal reflux disease)   . Hypertriglyceridemia   . Osteoarthritis     Past Surgical History:  Procedure Laterality Date  . COLONOSCOPY  01/02/2011   Procedure: COLONOSCOPY;  Surgeon: Jamesetta So;  [facility];  Service: Gastroenterology;  Laterality: N/A;  . LITHOTRIPSY  05/2009  . 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 allopurinol (ZYLOPRIM) 100 MG tablet Take 100 mg by mouth daily.   3  . aspirin 81 MG tablet Take 81 mg by mouth daily.      . fish oil-omega-3 fatty acids 1000 MG capsule Take 2 g by mouth daily.      . lansoprazole (PREVACID) 30 MG capsule Take 30 mg by mouth daily.      Marland Kitchen losartan (COZAAR) 100 MG tablet Take 100 mg by mouth daily.     . nitroGLYCERIN (NITROSTAT) 0.4 MG SL tablet Place 1 tablet (0.4 mg total) under the tongue every 5 (five) minutes as needed for chest pain. 25 tablet 3  . pravastatin (PRAVACHOL) 80 MG tablet Take 80 mg by mouth daily.   3   No current facility-administered medications for this visit.    Allergies:  Patient has no known allergies.   Social History: The patient  reports that he quit smoking about 40 years ago. His smoking use included cigarettes. He has never used smokeless tobacco. He reports that he drinks about 16.0 standard drinks of alcohol per week. He reports that he does not use drugs.   Family History: The patient's family history includes Heart disease in his father; Hypertension in his father.   ROS:  Please see the history of present illness. Otherwise, complete review of systems is positive for none.  All other systems are reviewed and negative.   Physical Exam: VS:  BP 120/70 (BP Location:  Left Arm)   Pulse 82   Ht 5\' 9"  (1.753 m)   Wt 197 lb (89.4 kg)   SpO2 96%   BMI 29.09 kg/m , BMI Body mass index is 29.09 kg/m.  Wt Readings from Last 3 Encounters:  02/13/18 197 lb (89.4 kg)  10/24/17 207 lb (93.9 kg)  01/02/11 208 lb (94.3 kg)    General: Patient appears comfortable at rest. HEENT: Conjunctiva and lids normal, oropharynx clear. Neck: Supple, no elevated JVP or carotid bruits, no thyromegaly. Lungs: Clear to auscultation, nonlabored breathing at rest. Cardiac: Regular rate and rhythm, no S3 or significant systolic murmur. Abdomen: Soft, nontender, bowel  sounds present, no guarding or rebound. Extremities: No pitting edema, distal pulses 2+. Skin: Warm and dry. Musculoskeletal: No kyphosis. Neuropsychiatric: Alert and oriented x3, affect grossly appropriate.  ECG: I personally reviewed the tracing from 10/24/2017 which showed sinus rhythm with poor R wave progression.  Recent Labwork:  March 2019: Cholesterol 156, HDL 25, LDL 111, triglycerides 100  Other Studies Reviewed Today:  Exercise Myoview 12/17/2017:  Blood pressure demonstrated a normal response to exercise.  Horizontal ST segment depression ST segment depression of 3 mm was noted during stress in the II, III, aVF and V5/V6 leads. 65mm ST elevation aVR. No symptoms with exercise.  Duke treadmill score is -8, suggesting intermediate risk based on Duke score alone  The left ventricular ejection fraction is mildly decreased (46%).  Mixed findings for risk assessment. High risk EKG changes with diffuse ST depressions and aVR ST elevation with exercise. Moderate risk Duke treadmill score. Mild LV systolic dysfunction. Imaging limited by severe gut radiotracer uptake, but would suggest only mild apical ischemia  If high concern for CAD, consider further testing to better clarify risk. Consider coronary CTA or invasive catheterization.  Assessment and Plan:  1.  History of exertional chest tightness and decreased stamina with exercise Myoview abnormal and concerning for underlying ischemic heart disease as detailed above.  We discussed proceeding to a diagnostic cardiac catheterization for clarification of coronary anatomy and determination of any potential revascularization options.  We reviewed the risks and benefits and he is in agreement to proceed.  Baseline lab work and chest x-ray will be obtained.  2.  Essential hypertension, he is on Cozaar, blood pressure is normal today.  3.  Hyperlipidemia on Pravachol, he follows with Dr. Gerarda Fraction.  LDL was 111 in March.  Anticipate further  medication adjustments particularly if CAD is diagnosed.  4.  Tobacco abuse in remission.  Current medicines were reviewed with the patient today.   Orders Placed This Encounter  Procedures  . DG Chest 2 View  . CBC w/Diff/Platelet  . Basic Metabolic Panel (BMET)    Disposition: Follow-up after procedure.  Signed, Satira Sark, MD, [facility] 02/13/2018 10:24 AM    [place] at [place]. [address] [place], [place] [address] Phone: [phone]; Fax: [phone]

## 2018-02-13 ENCOUNTER — Other Ambulatory Visit (HOSPITAL_COMMUNITY)
Admission: RE | Admit: 2018-02-13 | Discharge: 2018-02-13 | Disposition: A | Discharge status: Home / Self Care | Payer: PPO | Source: Ambulatory Visit | Attending: Cardiology | Admitting: Cardiology

## 2018-02-13 ENCOUNTER — Ambulatory Visit: Payer: PPO | Admitting: Cardiology

## 2018-02-13 ENCOUNTER — Other Ambulatory Visit: Payer: Self-pay | Admitting: Cardiology

## 2018-02-13 ENCOUNTER — Encounter: Payer: Self-pay | Admitting: Cardiology

## 2018-02-13 ENCOUNTER — Ambulatory Visit (HOSPITAL_COMMUNITY)
Admission: RE | Admit: 2018-02-13 | Discharge: 2018-02-13 | Disposition: A | Discharge status: Home / Self Care | Payer: PPO | Source: Ambulatory Visit | Attending: Cardiology | Admitting: Cardiology

## 2018-02-13 ENCOUNTER — Telehealth: Payer: Self-pay

## 2018-02-13 VITALS — BP 120/70 | HR 82 | Ht 69.0 in | Wt 197.0 lb

## 2018-02-13 DIAGNOSIS — Z01818 Encounter for other preprocedural examination: Secondary | ICD-10-CM | POA: Insufficient documentation

## 2018-02-13 DIAGNOSIS — J90 Pleural effusion, not elsewhere classified: Secondary | ICD-10-CM | POA: Diagnosis not present

## 2018-02-13 DIAGNOSIS — E875 Hyperkalemia: Secondary | ICD-10-CM

## 2018-02-13 DIAGNOSIS — F17201 Nicotine dependence, unspecified, in remission: Secondary | ICD-10-CM | POA: Diagnosis not present

## 2018-02-13 DIAGNOSIS — R079 Chest pain, unspecified: Secondary | ICD-10-CM | POA: Diagnosis not present

## 2018-02-13 DIAGNOSIS — R9439 Abnormal result of other cardiovascular function study: Secondary | ICD-10-CM | POA: Insufficient documentation

## 2018-02-13 DIAGNOSIS — R918 Other nonspecific abnormal finding of lung field: Secondary | ICD-10-CM | POA: Diagnosis not present

## 2018-02-13 DIAGNOSIS — I10 Essential (primary) hypertension: Secondary | ICD-10-CM | POA: Diagnosis not present

## 2018-02-13 DIAGNOSIS — E782 Mixed hyperlipidemia: Secondary | ICD-10-CM

## 2018-02-13 LAB — CBC WITH DIFFERENTIAL/PLATELET
Basophils Absolute: 0 10*3/uL (ref 0.0–0.1)
Basophils Relative: 0 %
EOS PCT: 2 %
Eosinophils Absolute: 0.2 10*3/uL (ref 0.0–0.7)
HCT: 47.1 % (ref 39.0–52.0)
HEMOGLOBIN: 15.8 g/dL (ref 13.0–17.0)
LYMPHS ABS: 1.6 10*3/uL (ref 0.7–4.0)
LYMPHS PCT: 19 %
MCH: 31.9 pg (ref 26.0–34.0)
MCHC: 33.5 g/dL (ref 30.0–36.0)
MCV: 95 fL (ref 78.0–100.0)
MONOS PCT: 11 %
Monocytes Absolute: 0.9 10*3/uL (ref 0.1–1.0)
Neutro Abs: 5.6 10*3/uL (ref 1.7–7.7)
Neutrophils Relative %: 68 %
Platelets: 228 10*3/uL (ref 150–400)
RBC: 4.96 MIL/uL (ref 4.22–5.81)
RDW: 12.4 % (ref 11.5–15.5)
WBC: 8.3 10*3/uL (ref 4.0–10.5)

## 2018-02-13 LAB — BASIC METABOLIC PANEL
Anion gap: 9 (ref 5–15)
BUN: 12 mg/dL (ref 8–23)
CHLORIDE: 103 mmol/L (ref 98–111)
CO2: 29 mmol/L (ref 22–32)
CREATININE: 1.05 mg/dL (ref 0.61–1.24)
Calcium: 9.6 mg/dL (ref 8.9–10.3)
GFR calc Af Amer: >60 mL/min (ref >60)
GFR calc non Af Amer: >60 mL/min (ref >60)
GLUCOSE: 115 mg/dL — AB (ref 70–99)
POTASSIUM: 5.4 mmol/L — AB (ref 3.5–5.1)
Sodium: 141 mmol/L (ref 135–145)

## 2018-02-13 NOTE — Telephone Encounter (Signed)
Pt will repeat potassium on Monday 02/17/18

## 2018-02-13 NOTE — Patient Instructions (Signed)
    [facility] [place] [id number] Dept: [phone] Loc: [place]  02/13/2018  You are scheduled for a Cardiac Catheterization on Tuesday, August 27 with Dr. Shelva Majestic.  1. Please arrive at the [facility] (Main Entrance A) at [facility]: [address] [place], [place] [address] at 8:30 AM (This time is two hours before your procedure to ensure your preparation). Free valet parking service is available.   Special note: Every effort is made to have your procedure done on time. Please understand that emergencies sometimes delay scheduled procedures.  2. Diet: Do not eat solid foods after midnight.  The patient may have clear liquids until 5am upon the day of the procedure.  3. Get lab work and chest x-ray today at [facility].  4. Medication instructions in preparation for your procedure:   Contrast Allergy: No  On the morning of your procedure, take your Aspirin 81 mg  and any morning medicines NOT listed above.  You may use sips of water.  5. Plan for one night stay--bring personal belongings. 6. Bring a current list of your medications and current insurance cards. 7. You MUST have a responsible person to drive you home. 8. Someone MUST be with you the first 24 hours after you arrive home or your discharge will be delayed. 9. Please wear clothes that are easy to get on and off and wear slip-on shoes.  Thank you for allowing [place] to care for you!   -- [facility]

## 2018-02-13 NOTE — Telephone Encounter (Signed)
-----   Message from Satira Sark, MD sent at 02/13/2018 12:02 PM EDT ----- Results reviewed.  Creatinine and GFR normal, hemoglobin normal, platelets normal.  Elevated potassium is new, would consider repeating the potassium level prior to making any changes as this could be spurious. A copy of this test should be forwarded to Redmond School, MD.

## 2018-02-14 ENCOUNTER — Other Ambulatory Visit: Payer: Self-pay

## 2018-02-14 DIAGNOSIS — J90 Pleural effusion, not elsewhere classified: Secondary | ICD-10-CM

## 2018-02-17 ENCOUNTER — Ambulatory Visit (HOSPITAL_COMMUNITY)
Admission: RE | Admit: 2018-02-17 | Discharge: 2018-02-17 | Disposition: A | Discharge status: Home / Self Care | Payer: PPO | Source: Ambulatory Visit | Attending: Internal Medicine | Admitting: Internal Medicine

## 2018-02-17 ENCOUNTER — Other Ambulatory Visit (HOSPITAL_COMMUNITY)
Admission: RE | Admit: 2018-02-17 | Discharge: 2018-02-17 | Disposition: A | Discharge status: Home / Self Care | Payer: PPO | Source: Ambulatory Visit | Attending: Cardiology | Admitting: Cardiology

## 2018-02-17 ENCOUNTER — Telehealth: Payer: Self-pay | Admitting: *Deleted

## 2018-02-17 DIAGNOSIS — I10 Essential (primary) hypertension: Secondary | ICD-10-CM | POA: Diagnosis not present

## 2018-02-17 DIAGNOSIS — Z87891 Personal history of nicotine dependence: Secondary | ICD-10-CM | POA: Insufficient documentation

## 2018-02-17 DIAGNOSIS — E875 Hyperkalemia: Secondary | ICD-10-CM | POA: Insufficient documentation

## 2018-02-17 DIAGNOSIS — J90 Pleural effusion, not elsewhere classified: Secondary | ICD-10-CM | POA: Insufficient documentation

## 2018-02-17 DIAGNOSIS — I351 Nonrheumatic aortic (valve) insufficiency: Secondary | ICD-10-CM

## 2018-02-17 DIAGNOSIS — I083 Combined rheumatic disorders of mitral, aortic and tricuspid valves: Secondary | ICD-10-CM | POA: Diagnosis not present

## 2018-02-17 DIAGNOSIS — K219 Gastro-esophageal reflux disease without esophagitis: Secondary | ICD-10-CM | POA: Diagnosis not present

## 2018-02-17 DIAGNOSIS — E781 Pure hyperglyceridemia: Secondary | ICD-10-CM | POA: Diagnosis not present

## 2018-02-17 LAB — BASIC METABOLIC PANEL
ANION GAP: 6 (ref 5–15)
BUN: 15 mg/dL (ref 8–23)
CALCIUM: 9.2 mg/dL (ref 8.9–10.3)
CO2: 29 mmol/L (ref 22–32)
Chloride: 105 mmol/L (ref 98–111)
Creatinine, Ser: 0.94 mg/dL (ref 0.61–1.24)
GFR calc non Af Amer: >60 mL/min (ref >60)
Glucose, Bld: 95 mg/dL (ref 70–99)
Potassium: 4.6 mmol/L (ref 3.5–5.1)
Sodium: 140 mmol/L (ref 135–145)

## 2018-02-17 NOTE — Telephone Encounter (Signed)
Pt contacted pre-catheterization scheduled at [facility] for: Tuesday February 18, 2018 10:30 AM Verified arrival time and place: [place] Entrance A at: 8 AM  No solid food after midnight prior to cath, clear liquids until 5 AM day of procedure. Verified no known allergies. Verified no diabetes medications.  AM meds can be  taken pre-cath with sip of water including: ASA 81 mg  Confirmed patient has responsible person to drive home post procedure and for 24 hours after you arrive home: yes

## 2018-02-17 NOTE — Progress Notes (Signed)
*  PRELIMINARY RESULTS* Echocardiogram 2D Echocardiogram has been performed.  Samuel Germany 02/17/2018, 2:43 PM

## 2018-02-18 ENCOUNTER — Other Ambulatory Visit: Payer: Self-pay

## 2018-02-18 ENCOUNTER — Ambulatory Visit (HOSPITAL_COMMUNITY)
Admission: RE | Admit: 2018-02-18 | Discharge: 2018-02-18 | Disposition: A | Discharge status: Home / Self Care | Payer: PPO | Source: Ambulatory Visit | Attending: Cardiovascular Disease | Admitting: Cardiovascular Disease

## 2018-02-18 ENCOUNTER — Encounter (HOSPITAL_COMMUNITY)
Admission: RE | Disposition: A | Discharge status: Home / Self Care | Payer: Self-pay | Source: Ambulatory Visit | Attending: Cardiovascular Disease

## 2018-02-18 DIAGNOSIS — R9439 Abnormal result of other cardiovascular function study: Secondary | ICD-10-CM | POA: Insufficient documentation

## 2018-02-18 DIAGNOSIS — Z7982 Long term (current) use of aspirin: Secondary | ICD-10-CM | POA: Diagnosis not present

## 2018-02-18 DIAGNOSIS — Z87891 Personal history of nicotine dependence: Secondary | ICD-10-CM | POA: Insufficient documentation

## 2018-02-18 DIAGNOSIS — R0789 Other chest pain: Secondary | ICD-10-CM | POA: Insufficient documentation

## 2018-02-18 DIAGNOSIS — I10 Essential (primary) hypertension: Secondary | ICD-10-CM | POA: Insufficient documentation

## 2018-02-18 DIAGNOSIS — E781 Pure hyperglyceridemia: Secondary | ICD-10-CM | POA: Insufficient documentation

## 2018-02-18 DIAGNOSIS — M1990 Unspecified osteoarthritis, unspecified site: Secondary | ICD-10-CM | POA: Diagnosis not present

## 2018-02-18 DIAGNOSIS — Z9889 Other specified postprocedural states: Secondary | ICD-10-CM | POA: Diagnosis not present

## 2018-02-18 DIAGNOSIS — Z79899 Other long term (current) drug therapy: Secondary | ICD-10-CM | POA: Diagnosis not present

## 2018-02-18 DIAGNOSIS — K219 Gastro-esophageal reflux disease without esophagitis: Secondary | ICD-10-CM | POA: Diagnosis not present

## 2018-02-18 DIAGNOSIS — Z8249 Family history of ischemic heart disease and other diseases of the circulatory system: Secondary | ICD-10-CM | POA: Insufficient documentation

## 2018-02-18 HISTORY — PX: LEFT HEART CATH AND CORONARY ANGIOGRAPHY: CATH118249

## 2018-02-18 SURGERY — LEFT HEART CATH AND CORONARY ANGIOGRAPHY
Anesthesia: LOCAL

## 2018-02-18 MED ORDER — VERAPAMIL HCL 2.5 MG/ML IV SOLN
INTRAVENOUS | Status: DC | PRN
  Administered 2018-02-18: 10 mL via INTRA_ARTERIAL

## 2018-02-18 MED ORDER — SODIUM CHLORIDE 0.9 % IV SOLN: 250.0000 mL | INTRAVENOUS | Status: DC | PRN

## 2018-02-18 MED ORDER — LIDOCAINE HCL (PF) 1 % IJ SOLN
INTRAMUSCULAR | Status: DC | PRN
  Administered 2018-02-18: 2 mL

## 2018-02-18 MED ORDER — VERAPAMIL HCL 2.5 MG/ML IV SOLN
INTRAVENOUS | Status: AC
  Filled 2018-02-18: qty 2

## 2018-02-18 MED ORDER — LIDOCAINE HCL (PF) 1 % IJ SOLN
INTRAMUSCULAR | Status: AC
  Filled 2018-02-18: qty 30

## 2018-02-18 MED ORDER — HEPARIN (PORCINE) IN NACL 1000-0.9 UT/500ML-% IV SOLN
INTRAVENOUS | Status: DC | PRN
  Administered 2018-02-18 (×2): 500 mL

## 2018-02-18 MED ORDER — HEPARIN (PORCINE) IN NACL 1000-0.9 UT/500ML-% IV SOLN
INTRAVENOUS | Status: AC
  Filled 2018-02-18: qty 500

## 2018-02-18 MED ORDER — IOHEXOL 350 MG/ML SOLN
INTRAVENOUS | Status: DC | PRN
  Administered 2018-02-18: 55 mL via INTRA_ARTERIAL

## 2018-02-18 MED ORDER — SODIUM CHLORIDE 0.9 % WEIGHT BASED INFUSION
3.0000 mL/kg/h | INTRAVENOUS | Status: AC
  Administered 2018-02-18: 3 mL/kg/h via INTRAVENOUS

## 2018-02-18 MED ORDER — SODIUM CHLORIDE 0.9 % WEIGHT BASED INFUSION: 1.0000 mL/kg/h | INTRAVENOUS | Status: DC

## 2018-02-18 MED ORDER — MIDAZOLAM HCL 2 MG/2ML IJ SOLN
INTRAMUSCULAR | Status: AC
  Filled 2018-02-18: qty 2

## 2018-02-18 MED ORDER — SODIUM CHLORIDE 0.9% FLUSH: 3.0000 mL | Freq: Two times a day (BID) | INTRAVENOUS | Status: DC

## 2018-02-18 MED ORDER — ASPIRIN 81 MG PO CHEW: 81.0000 mg | CHEWABLE_TABLET | ORAL | Status: DC

## 2018-02-18 MED ORDER — ACETAMINOPHEN 325 MG PO TABS: 650.0000 mg | ORAL_TABLET | ORAL | Status: DC | PRN

## 2018-02-18 MED ORDER — SODIUM CHLORIDE 0.9% FLUSH: 3.0000 mL | INTRAVENOUS | Status: DC | PRN

## 2018-02-18 MED ORDER — SODIUM CHLORIDE 0.9 % WEIGHT BASED INFUSION
1.0000 mL/kg/h | INTRAVENOUS | Status: DC
  Administered 2018-02-18: 200 mL via INTRAVENOUS

## 2018-02-18 MED ORDER — FENTANYL CITRATE (PF) 100 MCG/2ML IJ SOLN
INTRAMUSCULAR | Status: DC | PRN
  Administered 2018-02-18 (×2): 25 ug via INTRAVENOUS

## 2018-02-18 MED ORDER — HEPARIN SODIUM (PORCINE) 1000 UNIT/ML IJ SOLN
INTRAMUSCULAR | Status: DC | PRN
  Administered 2018-02-18: 4500 [IU] via INTRAVENOUS

## 2018-02-18 MED ORDER — DIAZEPAM 5 MG PO TABS: 5.0000 mg | ORAL_TABLET | ORAL | Status: DC | PRN

## 2018-02-18 MED ORDER — ONDANSETRON HCL 4 MG/2ML IJ SOLN: 4.0000 mg | Freq: Four times a day (QID) | INTRAMUSCULAR | Status: DC | PRN

## 2018-02-18 MED ORDER — FENTANYL CITRATE (PF) 100 MCG/2ML IJ SOLN
INTRAMUSCULAR | Status: AC
  Filled 2018-02-18: qty 2

## 2018-02-18 MED ORDER — MIDAZOLAM HCL 2 MG/2ML IJ SOLN
INTRAMUSCULAR | Status: DC | PRN
  Administered 2018-02-18 (×2): 1 mg via INTRAVENOUS

## 2018-02-18 MED ORDER — HEPARIN SODIUM (PORCINE) 1000 UNIT/ML IJ SOLN
INTRAMUSCULAR | Status: AC
  Filled 2018-02-18: qty 1

## 2018-02-18 SURGICAL SUPPLY — 11 items
CATH INFINITI 5FR ANG PIGTAIL (CATHETERS) ×1 IMPLANT
CATH OPTITORQUE TIG 4.0 5F (CATHETERS) ×1 IMPLANT
DEVICE RAD COMP TR BAND LRG (VASCULAR PRODUCTS) ×1 IMPLANT
GLIDESHEATH SLEND SS 6F .021 (SHEATH) ×1 IMPLANT
GUIDEWIRE INQWIRE 1.5J.035X260 (WIRE) IMPLANT
INQWIRE 1.5J .035X260CM (WIRE) ×2
KIT HEART LEFT (KITS) ×2 IMPLANT
PACK CARDIAC CATHETERIZATION (CUSTOM PROCEDURE TRAY) ×2 IMPLANT
SYR MEDRAD MARK V 150ML (SYRINGE) ×2 IMPLANT
TRANSDUCER W/STOPCOCK (MISCELLANEOUS) ×2 IMPLANT
TUBING CIL FLEX 10 FLL-RA (TUBING) ×2 IMPLANT

## 2018-02-18 NOTE — Research (Signed)
CADFEM Informed Consent   Subject Name: Adam Lewis  Subject met inclusion and exclusion criteria.  The informed consent form, study requirements and expectations were reviewed with the subject and questions and concerns were addressed prior to the signing of the consent form.  The subject verbalized understanding of the trail requirements.  The subject agreed to participate in the CADFEM trial and signed the informed consent.  The informed consent was obtained prior to performance of any protocol-specific procedures for the subject.  A copy of the signed informed consent was given to the subject and a copy was placed in the subject's medical record.  [name] 02/18/2018, 9:33 AM

## 2018-02-18 NOTE — Interval H&P Note (Signed)
Cath Lab Visit (complete for each Cath Lab visit)  Clinical Evaluation Leading to the Procedure:   ACS: No.  Non-ACS:    Anginal Classification: CCS II  Anti-ischemic medical therapy: No Therapy  Non-Invasive Test Results: Intermediate-risk stress test findings: cardiac mortality 1-3%/year  Prior CABG: No previous CABG      History and Physical Interval Note:  02/18/2018 10:52 AM  Adam Lewis  has presented today for surgery, with the diagnosis of cp  abn nuc  The various methods of treatment have been discussed with the patient and family. After consideration of risks, benefits and other options for treatment, the patient has consented to  Procedure(s): LEFT HEART CATH AND CORONARY ANGIOGRAPHY (N/A) as a surgical intervention .  The patient's history has been reviewed, patient examined, no change in status, stable for surgery.  I have reviewed the patient's chart and labs.  Questions were answered to the patient's satisfaction.     Shelva Majestic

## 2018-02-18 NOTE — Discharge Instructions (Signed)

## 2018-02-19 ENCOUNTER — Encounter (HOSPITAL_COMMUNITY): Payer: Self-pay | Admitting: Cardiovascular Disease

## 2018-03-17 ENCOUNTER — Telehealth: Payer: Self-pay | Admitting: Cardiology

## 2018-03-17 NOTE — Telephone Encounter (Signed)
Patient has had a family emergency and will be going out of town.  He will not be able to keep his appointment with Dr Domenic Polite Sept 27, 2019.  He is scheduled for surgery Mar 27, 2018 and questioned about the surgical clearance that was faxed to the [place] office.  Stated that the doctors office has not received it back yet  Please call patient on his cell phone

## 2018-03-18 NOTE — Telephone Encounter (Signed)
Pt's clearance form has been at this office,per Dr.McDowell pt has to be seen first.Apt made for 9/27, now pt needs to cancel.Asked him to call the office to re-schedule

## 2018-03-21 ENCOUNTER — Ambulatory Visit: Payer: PPO | Admitting: Cardiology

## 2018-03-26 NOTE — Progress Notes (Signed)
Cardiology Office Note  Date: 03/27/2018   ID: Daden, Mahany 09-25-1945, MRN [id number]  PCP: Redmond School, MD  Primary Cardiologist: Rozann Lesches, MD   Chief Complaint  Patient presents with  . Cardiac follow-up    History of Present Illness: Adam Lewis is a 72 y.o. male last seen in August.  He was referred at that time for a cardiac catheterization following abnormal Myoview study in the setting of exertional chest discomfort.  Procedure was performed by Dr. Claiborne Billings on August 27.  Fortunately, he had normal epicardial coronary arteries with LVEF 55 to 60% but elevated LVEDP of 27 mmHg.  Medical therapy for diastolic dysfunction recommended.  He presents today for a routine visit.  States that he was reassured by the results of the cardiac catheterization.  We did discuss possible contribution from diastolic dysfunction and his symptoms, today's blood pressure actually is very well controlled and he is on ARB at good dose.  We talked about the possibility of considering addition of a low-dose diuretic depending on how he does.  No additional cardiac testing is planned at this time.  He already had an echocardiogram as detailed below.  He is preparing for right carpal tunnel surgery within the next few weeks at the surgical outpatient center.  He should be able to proceed relatively low risk from a cardiac perspective based on his recent testing.  Past Medical History:  Diagnosis Date  . Essential hypertension   . GERD (gastroesophageal reflux disease)   . Hypertriglyceridemia   . Osteoarthritis     Past Surgical History:  Procedure Laterality Date  . COLONOSCOPY  01/02/2011   Procedure: COLONOSCOPY;  Surgeon: Jamesetta So;  [facility];  Service: Gastroenterology;  Laterality: N/A;  . LEFT HEART CATH AND CORONARY ANGIOGRAPHY N/A 02/18/2018   Procedure: LEFT HEART CATH AND CORONARY ANGIOGRAPHY;  Surgeon: Troy Sine, MD;  Location: [place] [facility];  Service: Cardiovascular;  Laterality: N/A;  . LITHOTRIPSY  05/2009  . 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 allopurinol (ZYLOPRIM) 100 MG tablet Take 100 mg by mouth daily.   3  . aspirin 81 MG tablet Take 81 mg by mouth daily.      . fish oil-omega-3 fatty acids 1000 MG capsule Take 2 g by mouth daily.      . lansoprazole (PREVACID) 30 MG capsule Take 30 mg by mouth daily.      Marland Kitchen losartan (COZAAR) 100 MG tablet Take 100 mg by mouth daily.     . nitroGLYCERIN (NITROSTAT) 0.4 MG SL tablet Place 1 tablet (0.4 mg total) under the tongue every 5 (five) minutes as needed for chest pain. 25 tablet 3  . pravastatin (PRAVACHOL) 80 MG tablet Take 80 mg by mouth daily.   3   No current facility-administered medications for this visit.    Allergies:  Patient has no known allergies.   Social History: The patient  reports that he quit smoking about 40 years ago. His smoking use included cigarettes. He has never used smokeless tobacco. He reports that he drinks about 16.0 standard drinks of alcohol per week. He reports that he does not use drugs.   ROS:  Please see the history of present illness. Otherwise, complete review of systems is positive for paresthesia and muscle weakness right hand.  All other systems are reviewed and negative.   Physical  Exam: VS:  BP 112/72   Pulse 60   Ht _0  (1.753 m)   Wt 200 lb (90.7 kg)   SpO2 98%   BMI 29.53 kg/m , BMI Body mass index is 29.53 kg/m.  Wt Readings from Last 3 Encounters:  03/27/18 200 lb (90.7 kg)  02/18/18 195 lb (88.5 kg)  02/13/18 197 lb (89.4 kg)    General: Patient appears comfortable at rest. HEENT: Conjunctiva and lids normal, oropharynx clear. Neck: Supple, no elevated JVP or carotid bruits, no thyromegaly. Lungs: Clear to auscultation, nonlabored breathing at rest. Cardiac: Regular rate and rhythm, no S3 or significant systolic  murmur, no pericardial rub. Abdomen: Soft, nontender, bowel sounds present. Extremities: No pitting edema, distal pulses 2+. Skin: Warm and dry. Musculoskeletal: No kyphosis. Neuropsychiatric: Alert and oriented x3, affect grossly appropriate.  ECG: I personally reviewed the tracing from 02/18/2018 which showed sinus rhythm with incomplete right bundle branch block, left anterior fascicular block, nonspecific ST-T changes.  Recent Labwork: 02/13/2018: Hemoglobin 15.8; Platelets 228 02/17/2018: BUN 15; Creatinine, Ser 0.94; Potassium 4.6; Sodium 140   Other Studies Reviewed Today:  Cardiac catheterization 02/18/2018: Normal epicardial coronary arteries dominant left circumflex artery circulation.  No evidence for obstructive disease.  Elevated LVEDP at 27 mmHg in this patient with documented EF of 55 to 60% with moderate left ventricular hypertrophy and grade 3 diastolic dysfunction on echo Doppler assessment yesterday, February 17, 2018.  RECOMMENDATION: Medical therapy for diastolic dysfunction.  Consider future cardiopulmonary met exercise testing if concern for microvascular etiology.  No indication for antiplatelet therapy at this time.  Echocardiogram 02/17/2018: Study Conclusions  - Left ventricle: The cavity size was normal. Wall thickness was   increased in a pattern of moderate LVH. Systolic function was   normal. The estimated ejection fraction was in the range of 55%   to 60%. Wall motion was normal; there were no regional wall   motion abnormalities. Doppler parameters are consistent with   restrictive physiology, indicative of decreased left ventricular   diastolic compliance and/or increased left atrial pressure. - Aortic valve: Mildly calcified annulus. Trileaflet; mildly   calcified leaflets. Moderate calcification involving the   noncoronary cusp. There was mild regurgitation. - Mitral valve: Moderately calcified annulus. There was trivial   regurgitation. - Left  atrium: The atrium was mildly to moderately dilated. - Right atrium: Central venous pressure (est): 3 mm Hg. - Atrial septum: No defect or patent foramen ovale was identified. - Tricuspid valve: There was trivial regurgitation. - Pulmonary arteries: PA peak pressure: 38 mm Hg (S). - Pericardium, extracardiac: There was no pericardial effusion.  Assessment and Plan:  1.  History of exertional dyspnea and chest tightness.  Screening exercise Myoview was abnormal including both ECG changes and a mild apical perfusion defect.  Fortunately however, diagnostic cardiac catheterization showed no significant disease in the major epicardial vessels.  Possibility of microvascular angina is still to be considered although this would be managed with medications and exercise.  He also had an elevated LVEDP suggesting diastolic dysfunction as a component of his symptoms.  We discussed these issues today.  Continue ARB, could consider addition of a low-dose diuretic depending on blood pressure control and symptoms as he follows along with PCP.  No additional cardiac testing is planned at this time.  We will also continue efforts at lipid control on statin therapy.  2.  Essential hypertension, blood pressure is well controlled today.  3.  Preoperative cardiac evaluation.  Patient is pending  elective right carpal tunnel surgery in the next few weeks in the outpatient surgical center.  He should be able to proceed at relatively low perioperative cardiac risk.  4.  Mixed hyperlipidemia on Pravachol.  Continue to follow with PCP.  Current medicines were reviewed with the patient today.  Disposition: Follow-up as needed.  Signed, Satira Sark, MD, [facility] 03/27/2018 2:42 PM    [facility] [place], [place], [place] [address] Phone: [phone]; Fax: [phone]

## 2018-03-27 ENCOUNTER — Ambulatory Visit: Payer: PPO | Admitting: Cardiology

## 2018-03-27 ENCOUNTER — Encounter: Payer: Self-pay | Admitting: Cardiology

## 2018-03-27 VITALS — BP 112/72 | HR 60 | Ht 69.0 in | Wt 200.0 lb

## 2018-03-27 DIAGNOSIS — R079 Chest pain, unspecified: Secondary | ICD-10-CM

## 2018-03-27 DIAGNOSIS — Z01810 Encounter for preprocedural cardiovascular examination: Secondary | ICD-10-CM | POA: Diagnosis not present

## 2018-03-27 DIAGNOSIS — E782 Mixed hyperlipidemia: Secondary | ICD-10-CM | POA: Diagnosis not present

## 2018-03-27 DIAGNOSIS — I10 Essential (primary) hypertension: Secondary | ICD-10-CM | POA: Diagnosis not present

## 2018-03-27 NOTE — Patient Instructions (Addendum)
Medication Instructions:   Your physician recommends that you continue on your current medications as directed. Please refer to the Current Medication list given to you today.  Labwork:  NONE  Testing/Procedures:  NONE  Follow-Up:  Your physician recommends that you schedule a follow-up appointment in: as needed.   Any Other Special Instructions Will Be Listed Below (If Applicable).  If you need a refill on your cardiac medications before your next appointment, please call your pharmacy. 

## 2018-04-03 DIAGNOSIS — G5601 Carpal tunnel syndrome, right upper limb: Secondary | ICD-10-CM | POA: Diagnosis not present

## 2018-04-08 ENCOUNTER — Ambulatory Visit: Payer: PPO | Admitting: Cardiology

## 2018-04-14 DIAGNOSIS — Z9889 Other specified postprocedural states: Secondary | ICD-10-CM | POA: Diagnosis not present

## 2018-04-15 DIAGNOSIS — Z23 Encounter for immunization: Secondary | ICD-10-CM | POA: Diagnosis not present

## 2018-05-05 DIAGNOSIS — Z9889 Other specified postprocedural states: Secondary | ICD-10-CM | POA: Diagnosis not present

## 2018-05-05 DIAGNOSIS — H903 Sensorineural hearing loss, bilateral: Secondary | ICD-10-CM | POA: Diagnosis not present

## 2018-05-30 ENCOUNTER — Other Ambulatory Visit (HOSPITAL_COMMUNITY): Payer: Self-pay | Admitting: Internal Medicine

## 2018-05-30 ENCOUNTER — Ambulatory Visit (HOSPITAL_COMMUNITY)
Admission: RE | Admit: 2018-05-30 | Discharge: 2018-05-30 | Disposition: A | Discharge status: Home / Self Care | Payer: PPO | Source: Ambulatory Visit | Attending: Internal Medicine | Admitting: Internal Medicine

## 2018-05-30 DIAGNOSIS — R0601 Orthopnea: Secondary | ICD-10-CM | POA: Diagnosis not present

## 2018-05-30 DIAGNOSIS — R5383 Other fatigue: Secondary | ICD-10-CM | POA: Diagnosis not present

## 2018-05-30 DIAGNOSIS — I119 Hypertensive heart disease without heart failure: Secondary | ICD-10-CM | POA: Diagnosis not present

## 2018-05-30 DIAGNOSIS — I10 Essential (primary) hypertension: Secondary | ICD-10-CM | POA: Diagnosis not present

## 2018-05-30 DIAGNOSIS — R05 Cough: Secondary | ICD-10-CM | POA: Diagnosis not present

## 2018-05-30 DIAGNOSIS — R0600 Dyspnea, unspecified: Secondary | ICD-10-CM | POA: Diagnosis not present

## 2018-05-30 DIAGNOSIS — Z1389 Encounter for screening for other disorder: Secondary | ICD-10-CM | POA: Diagnosis not present

## 2018-05-30 DIAGNOSIS — E663 Overweight: Secondary | ICD-10-CM | POA: Diagnosis not present

## 2018-05-30 DIAGNOSIS — I5030 Unspecified diastolic (congestive) heart failure: Secondary | ICD-10-CM | POA: Diagnosis not present

## 2018-05-30 DIAGNOSIS — Z6829 Body mass index (BMI) 29.0-29.9, adult: Secondary | ICD-10-CM | POA: Diagnosis not present

## 2018-05-30 DIAGNOSIS — I5033 Acute on chronic diastolic (congestive) heart failure: Secondary | ICD-10-CM | POA: Diagnosis not present

## 2018-05-30 DIAGNOSIS — R0602 Shortness of breath: Secondary | ICD-10-CM | POA: Diagnosis not present

## 2018-06-03 ENCOUNTER — Other Ambulatory Visit (HOSPITAL_COMMUNITY): Payer: Self-pay | Admitting: Physician Assistant

## 2018-06-03 ENCOUNTER — Ambulatory Visit: Payer: PPO | Admitting: Cardiology

## 2018-06-03 ENCOUNTER — Ambulatory Visit (HOSPITAL_COMMUNITY)
Admission: RE | Admit: 2018-06-03 | Discharge: 2018-06-03 | Disposition: A | Discharge status: Home / Self Care | Payer: PPO | Source: Ambulatory Visit | Attending: Physician Assistant | Admitting: Physician Assistant

## 2018-06-03 ENCOUNTER — Encounter: Payer: Self-pay | Admitting: Cardiology

## 2018-06-03 VITALS — BP 114/60 | HR 70 | Ht 69.0 in | Wt 201.4 lb

## 2018-06-03 DIAGNOSIS — E782 Mixed hyperlipidemia: Secondary | ICD-10-CM

## 2018-06-03 DIAGNOSIS — Z0389 Encounter for observation for other suspected diseases and conditions ruled out: Secondary | ICD-10-CM | POA: Diagnosis not present

## 2018-06-03 DIAGNOSIS — I5033 Acute on chronic diastolic (congestive) heart failure: Secondary | ICD-10-CM

## 2018-06-03 DIAGNOSIS — R0602 Shortness of breath: Secondary | ICD-10-CM | POA: Diagnosis not present

## 2018-06-03 DIAGNOSIS — I509 Heart failure, unspecified: Secondary | ICD-10-CM | POA: Insufficient documentation

## 2018-06-03 DIAGNOSIS — R0609 Other forms of dyspnea: Secondary | ICD-10-CM

## 2018-06-03 DIAGNOSIS — I10 Essential (primary) hypertension: Secondary | ICD-10-CM | POA: Diagnosis not present

## 2018-06-03 DIAGNOSIS — IMO0001 Reserved for inherently not codable concepts without codable children: Secondary | ICD-10-CM

## 2018-06-03 MED ORDER — IOPAMIDOL (ISOVUE-370) INJECTION 76%
100.0000 mL | Freq: Once | INTRAVENOUS | Status: AC | PRN
  Administered 2018-06-03: 100 mL via INTRAVENOUS

## 2018-06-03 NOTE — Patient Instructions (Signed)
Medication Instructions:  Your physician recommends that you continue on your current medications as directed. Please refer to the Current Medication list given to you today.   Labwork: NONE  Testing/Procedures: NONE  Follow-Up: Your physician recommends that you schedule a follow-up appointment in: 2-3 WEEKS    Any Other Special Instructions Will Be Listed Below (If Applicable).     If you need a refill on your cardiac medications before your next appointment, please call your pharmacy.

## 2018-06-03 NOTE — Progress Notes (Signed)
Cardiology Office Note  Date: 06/03/2018   ID: Finlay, Godbee 02/19/1946, MRN [id number]  PCP: Redmond School, MD  Consulting Cardiologist: Rozann Lesches, MD   Chief Complaint  Patient presents with  . Shortness of Breath    History of Present Illness: Adam Lewis is a 72 y.o. male referred to the office by Dr. Gerarda Fraction for evaluation of worsening shortness of breath.  He was last seen in October.  He is here with his wife.  He tells me that over the last few weeks he has been having a recurring dry cough and subsequently increasing dyspnea on exertion.  No definite orthopnea or PND, no progressive chest pain.  His weight is relatively stable, up by 1 pound since October.  He has had no fevers or chills.  No progressive leg swelling.  He was seen by PCP with lab work obtained, outlined below.  Also had a chest x-ray which showed minimal interstitial opacities and small pleural effusions.  He has a chest CT ordered for today.  Medications were modified with the addition of atenolol and also low-dose HCTZ.  He has been on these medications for about 4 days.  We discussed his previous cardiac testing from August.  He has normal coronaries although the possibility of microvascular angina still remains.  LVEF was normal at 94-17% with diastolic dysfunction and increased filling pressures, PASP 38 mmHg.  We discussed his current medications.   Past Medical History:  Diagnosis Date  . Essential hypertension   . GERD (gastroesophageal reflux disease)   . Hypertriglyceridemia   . Osteoarthritis     Past Surgical History:  Procedure Laterality Date  . COLONOSCOPY  01/02/2011   Procedure: COLONOSCOPY;  Surgeon: Jamesetta So;  [facility];  Service: Gastroenterology;  Laterality: N/A;  . LEFT HEART CATH AND CORONARY ANGIOGRAPHY N/A 02/18/2018   Procedure: LEFT HEART CATH AND CORONARY ANGIOGRAPHY;  Surgeon: Troy Sine, MD;  Location: [place] [facility];   Service: Cardiovascular;  Laterality: N/A;  . LITHOTRIPSY  05/2009  . 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 allopurinol (ZYLOPRIM) 100 MG tablet Take 100 mg by mouth daily.   3  . aspirin 81 MG tablet Take 81 mg by mouth daily.      Marland Kitchen atenolol (TENORMIN) 25 MG tablet Take 25 mg by mouth daily.  11  . fish oil-omega-3 fatty acids 1000 MG capsule Take 2 g by mouth daily.      . hydrochlorothiazide (MICROZIDE) 12.5 MG capsule     . lansoprazole (PREVACID) 30 MG capsule Take 30 mg by mouth daily.      Marland Kitchen losartan (COZAAR) 100 MG tablet Take 100 mg by mouth daily.     . nitroGLYCERIN (NITROSTAT) 0.4 MG SL tablet Place 1 tablet (0.4 mg total) under the tongue every 5 (five) minutes as needed for chest pain. 25 tablet 3  . pravastatin (PRAVACHOL) 80 MG tablet Take 80 mg by mouth daily.   3   No current facility-administered medications for this visit.    Allergies:  Patient has no known allergies.   Social History: The patient  reports that he quit smoking about 40 years ago. His smoking use included cigarettes. He has never used smokeless tobacco. He reports that he drinks about 16.0 standard drinks of alcohol per week. He reports that he does not use drugs.   ROS:  Please see the history of present illness. Otherwise, complete review of systems is positive for fatigue.  All other systems are reviewed and negative.   Physical Exam: VS:  BP 114/60   Pulse 70   Ht '5\' 9"'  (1.753 m)   Wt 201 lb 6.4 oz (91.4 kg)   SpO2 93%   BMI 29.74 kg/m , BMI Body mass index is 29.74 kg/m.  Wt Readings from Last 3 Encounters:  06/03/18 201 lb 6.4 oz (91.4 kg)  03/27/18 200 lb (90.7 kg)  02/18/18 195 lb (88.5 kg)    General: Patient appears comfortable at rest. HEENT: Conjunctiva and lids normal, oropharynx clear. Neck: Supple, no elevated JVP or carotid bruits, no thyromegaly. Lungs: Decreased breath sounds at the bases,  right worse than left, nonlabored breathing at rest. Cardiac: Regular rate and rhythm, no S3, soft systolic murmur. Abdomen: Soft, nontender, bowel sounds present. Extremities: 1+ lower leg edema, distal pulses 2+. Skin: Warm and dry. Musculoskeletal: No kyphosis. Neuropsychiatric: Alert and oriented x3, affect grossly appropriate.  ECG: I personally reviewed the tracing from  02/18/2018 which showed sinus rhythm with incomplete right bundle branch block, left anterior fascicular block, nonspecific ST-T changes.  Recent Labwork: 02/13/2018: Hemoglobin 15.8; Platelets 228 02/17/2018: BUN 15; Creatinine, Ser 0.94; Potassium 4.6; Sodium 140  December 2019: Hemoglobin 14.9, platelets 288, troponin I 0.08, BUN 14, creatinine 1.09, potassium 4.9, AST 27, ALT 26  Other Studies Reviewed Today:  Cardiac catheterization 02/18/2018: Normal epicardial coronary arteries dominant left circumflex artery circulation. No evidence for obstructive disease.  Elevated LVEDP at 27 mmHg in this patient with documented EF of 55 to 60% with moderate left ventricular hypertrophy and grade 3 diastolic dysfunction on echo Doppler assessment yesterday, February 17, 2018.  RECOMMENDATION: Medical therapy for diastolic dysfunction. Consider future cardiopulmonary met exercise testing if concern for microvascular etiology.  No indication for antiplatelet therapy at this time.  Echocardiogram 02/17/2018: Study Conclusions  - Left ventricle: The cavity size was normal. Wall thickness was increased in a pattern of moderate LVH. Systolic function was normal. The estimated ejection fraction was in the range of 55% to 60%. Wall motion was normal; there were no regional wall motion abnormalities. Doppler parameters are consistent with restrictive physiology, indicative of decreased left ventricular diastolic compliance and/or increased left atrial pressure. - Aortic valve: Mildly calcified annulus.  Trileaflet; mildly calcified leaflets. Moderate calcification involving the noncoronary cusp. There was mild regurgitation. - Mitral valve: Moderately calcified annulus. There was trivial regurgitation. - Left atrium: The atrium was mildly to moderately dilated. - Right atrium: Central venous pressure (est): 3 mm Hg. - Atrial septum: No defect or patent foramen ovale was identified. - Tricuspid valve: There was trivial regurgitation. - Pulmonary arteries: PA peak pressure: 38 mm Hg (S). - Pericardium, extracardiac: There was no pericardial effusion.  Chest x-ray 05/30/2018: FINDINGS: The heart size and mediastinal contours are within normal limits. No pneumothorax is noted. Minimal interstitial densities are noted throughout both lungs which may represent edema. Small bilateral pleural effusions are noted. The visualized skeletal structures are unremarkable.  IMPRESSION: Minimal bilateral interstitial densities are noted suggesting pulmonary edema. Small pleural effusions are noted.  Assessment and Plan:  1.  Dyspnea on exertion, suspect acute on chronic diastolic heart failure based on his work-up within the last 6 months, recent chest x-ray suggesting fluid overload.  Elevated BNP and minimally increased troponin I are also likely reflective of myocardial strain.  He has normal coronary arteries documented as of August and  I suspect he has microvascular dysfunction as well.  He had an elevated LVEDP and diastolic dysfunction by echocardiogram as well.  I agree with the addition of atenolol and HCTZ, although I suspect he will need a more potent diuretic.  He is scheduled for a chest CT today by PCP, once these results are reviewed (doubt pulmonary embolus), we can make further adjustments in his medication and bring him back for office follow-up.  2.  Normal coronary arteries at cardiac catheterization in August.  3.  Hypertension, blood pressure is well controlled today.  4.   Mixed hyperlipidemia on Pravachol.  Current medicines were reviewed with the patient today.  Disposition: Follow-up in the next few weeks.  Signed, Satira Sark, MD, [facility] 06/03/2018 11:37 AM    [facility] [place], [place], [place] [address] Phone: [phone]; Fax: [phone]

## 2018-06-09 ENCOUNTER — Telehealth: Payer: Self-pay | Admitting: Cardiology

## 2018-06-09 NOTE — Telephone Encounter (Signed)
Patient states that he has been on new medications and is still not feeling any better. Would like return phone call. / tg

## 2018-06-09 NOTE — Telephone Encounter (Signed)
Called pt, no answer. Unable to leave msg.  

## 2018-06-12 NOTE — Telephone Encounter (Signed)
Called pt. No answer, left message for pt to return call.  

## 2018-06-16 DIAGNOSIS — H903 Sensorineural hearing loss, bilateral: Secondary | ICD-10-CM | POA: Diagnosis not present

## 2018-06-19 NOTE — Progress Notes (Deleted)
Cardiology Office Note  Date: 06/19/2018   ID: Frazier, Balfour 1945/09/04, MRN [id number]  PCP: Redmond School, MD  Primary Cardiologist: Rozann Lesches, MD   No chief complaint on file.   History of Present Illness: Adam Lewis is a 72 y.o. male seen recently for follow-up of shortness of breath.  Chest CT from December 10 was negative for pulmonary embolus but did show moderate layering pleural effusions and minimal pulmonary edema.  Past Medical History:  Diagnosis Date  . Essential hypertension   . GERD (gastroesophageal reflux disease)   . Hypertriglyceridemia   . Osteoarthritis     Past Surgical History:  Procedure Laterality Date  . COLONOSCOPY  01/02/2011   Procedure: COLONOSCOPY;  Surgeon: Jamesetta So;  [facility];  Service: Gastroenterology;  Laterality: N/A;  . LEFT HEART CATH AND CORONARY ANGIOGRAPHY N/A 02/18/2018   Procedure: LEFT HEART CATH AND CORONARY ANGIOGRAPHY;  Surgeon: Troy Sine, MD;  Location: [facility];  Service: Cardiovascular;  Laterality: N/A;  . LITHOTRIPSY  05/2009  . 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 allopurinol (ZYLOPRIM) 100 MG tablet Take 100 mg by mouth daily.   3  . aspirin 81 MG tablet Take 81 mg by mouth daily.      Marland Kitchen atenolol (TENORMIN) 25 MG tablet Take 25 mg by mouth daily.  11  . fish oil-omega-3 fatty acids 1000 MG capsule Take 2 g by mouth daily.      . hydrochlorothiazide (MICROZIDE) 12.5 MG capsule     . lansoprazole (PREVACID) 30 MG capsule Take 30 mg by mouth daily.      Marland Kitchen losartan (COZAAR) 100 MG tablet Take 100 mg by mouth daily.     . nitroGLYCERIN (NITROSTAT) 0.4 MG SL tablet Place 1 tablet (0.4 mg total) under the tongue every 5 (five) minutes as needed for chest pain. 25 tablet 3  . pravastatin (PRAVACHOL) 80 MG tablet Take 80 mg by mouth daily.   3   No current facility-administered  medications for this visit.    Allergies:  Bee venom   Social History: The patient  reports that he quit smoking about 40 years ago. His smoking use included cigarettes. He has never used smokeless tobacco. He reports current alcohol use of about 16.0 standard drinks of alcohol per week. He reports that he does not use drugs.   Family History: The patient's family history includes Heart disease in his father; Hypertension in his father.   ROS:  Please see the history of present illness. Otherwise, complete review of systems is positive for {NONE DEFAULTED:18576::"none"}.  All other systems are reviewed and negative.   Physical Exam: VS:  There were no vitals taken for this visit., BMI There is no height or weight on file to calculate BMI.  Wt Readings from Last 3 Encounters:  06/03/18 201 lb 6.4 oz (91.4 kg)  03/27/18 200 lb (90.7 kg)  02/18/18 195 lb (88.5 kg)    General: Patient appears comfortable at rest. HEENT: Conjunctiva and lids normal, oropharynx clear with moist mucosa. Neck: Supple, no elevated JVP or carotid bruits, no thyromegaly. Lungs: Clear to auscultation, nonlabored breathing at rest. Cardiac: Regular rate and rhythm, no S3 or significant systolic murmur, no pericardial rub. Abdomen: Soft, nontender, no hepatomegaly, bowel sounds present, no guarding or rebound. Extremities: No pitting edema, distal pulses 2+. Skin: Warm and dry. Musculoskeletal:  No kyphosis. Neuropsychiatric: Alert and oriented x3, affect grossly appropriate.  ECG: I personally reviewed the tracing from 8/27/319 which showed sinus rhythm with incomplete right bundle branch block, left anterior fascicular block, diffuse nonspecific ST-T changes.  Recent Labwork: 02/13/2018: Hemoglobin 15.8; Platelets 228 02/17/2018: BUN 15; Creatinine, Ser 0.94; Potassium 4.6; Sodium 140   Other Studies Reviewed Today:  Chest CT 06/03/2018: FINDINGS: Cardiovascular: Satisfactory opacification of the pulmonary  arteries to the segmental level. No evidence of pulmonary embolism. Normal heart size. No pericardial effusion.  Mediastinum/Nodes: Negative for adenopathy or mass  Lungs/Pleura: Moderate layering pleural effusions with mild atelectasis. Minimal interlobular septal thickening at the bases.  Upper Abdomen: Haziness of the visualized omentum/left upper quadrant mesentery presumably related to volume overload given the above. No superimposed nodularity is seen.  Musculoskeletal: Spondylosis.  No acute or aggressive finding  Review of the MIP images confirms the above findings.  IMPRESSION: 1. Moderate layering pleural effusions and minimal pulmonary edema. 2. Negative for pulmonary embolism.  Cardiac catheterization 02/18/2018: Normal epicardial coronary arteries dominant left circumflex artery circulation. No evidence for obstructive disease.  Elevated LVEDP at 27 mmHg in this patient with documented EF of 55 to 60% with moderate left ventricular hypertrophy and grade 3 diastolic dysfunction on echo Doppler assessment yesterday, February 17, 2018.  RECOMMENDATION: Medical therapy for diastolic dysfunction. Consider future cardiopulmonary met exercise testing if concern for microvascular etiology.  No indication for antiplatelet therapy at this time.  Echocardiogram 02/17/2018: Study Conclusions  - Left ventricle: The cavity size was normal. Wall thickness was increased in a pattern of moderate LVH. Systolic function was normal. The estimated ejection fraction was in the range of 55% to 60%. Wall motion was normal; there were no regional wall motion abnormalities. Doppler parameters are consistent with restrictive physiology, indicative of decreased left ventricular diastolic compliance and/or increased left atrial pressure. - Aortic valve: Mildly calcified annulus. Trileaflet; mildly calcified leaflets. Moderate calcification involving the noncoronary  cusp. There was mild regurgitation. - Mitral valve: Moderately calcified annulus. There was trivial regurgitation. - Left atrium: The atrium was mildly to moderately dilated. - Right atrium: Central venous pressure (est): 3 mm Hg. - Atrial septum: No defect or patent foramen ovale was identified. - Tricuspid valve: There was trivial regurgitation. - Pulmonary arteries: PA peak pressure: 38 mm Hg (S). - Pericardium, extracardiac: There was no pericardial effusion.   Assessment and Plan:   Current medicines were reviewed with the patient today.  No orders of the defined types were placed in this encounter.   Disposition:  Signed, Satira Sark, MD, [facility] 06/19/2018 3:38 PM    [place] [facility] [address] [place], [place] [address] Phone: [phone]; Fax: [phone]

## 2018-06-20 ENCOUNTER — Encounter: Payer: Self-pay | Admitting: Cardiology

## 2018-06-20 ENCOUNTER — Ambulatory Visit: Payer: PPO | Admitting: Cardiology

## 2018-06-20 VITALS — BP 105/63 | HR 69 | Ht 69.0 in | Wt 198.8 lb

## 2018-06-20 DIAGNOSIS — R0609 Other forms of dyspnea: Secondary | ICD-10-CM

## 2018-06-20 DIAGNOSIS — E782 Mixed hyperlipidemia: Secondary | ICD-10-CM

## 2018-06-20 DIAGNOSIS — Z79899 Other long term (current) drug therapy: Secondary | ICD-10-CM

## 2018-06-20 DIAGNOSIS — I10 Essential (primary) hypertension: Secondary | ICD-10-CM | POA: Diagnosis not present

## 2018-06-20 DIAGNOSIS — J90 Pleural effusion, not elsewhere classified: Secondary | ICD-10-CM

## 2018-06-20 MED ORDER — LOSARTAN POTASSIUM 50 MG PO TABS: 50.0000 mg | ORAL_TABLET | Freq: Every day | ORAL | 3 refills | Status: DC

## 2018-06-20 MED ORDER — POTASSIUM CHLORIDE ER 10 MEQ PO TBCR: 10.0000 meq | EXTENDED_RELEASE_TABLET | Freq: Every day | ORAL | 3 refills | Status: DC

## 2018-06-20 MED ORDER — FUROSEMIDE 40 MG PO TABS: 40.0000 mg | ORAL_TABLET | Freq: Every day | ORAL | 3 refills | Status: DC

## 2018-06-20 NOTE — Progress Notes (Signed)
Cardiology Office Note  Date: 06/20/2018   ID: Adam, Lewis 1946/02/10, MRN [id number]  PCP: Redmond School, MD  Primary Cardiologist: Rozann Lesches, MD   Chief Complaint  Patient presents with  . Shortness of Breath    History of Present Illness: Adam Lewis is a 72 y.o. male seen recently for evaluation of shortness of breath.  He is here today with his wife for a follow-up visit.  He had been seen by Dr. Gerarda Fraction, placed on HCTZ and referred for a chest CT.  He got a call back from his PCP indicating that his chest CT was "okay," I reviewed the results and went over them with the patient and his wife today.  Chest CT from December 10 was negative for pulmonary embolus but did show moderate layering pleural effusions and minimal pulmonary edema.  He does not report any improvement in shortness of breath, still has intermittent dry cough since being on HCTZ.  His weight is about the same, down 2 pounds.  Previous cardiac work-up as outlined below.  We went over his medications and discussed further adjustments.  He does tell me that he has had some orthostatic lightheadedness, blood pressure is low normal today.  No palpitations or syncope.  No fevers or chills.  Past Medical History:  Diagnosis Date  . Essential hypertension   . GERD (gastroesophageal reflux disease)   . Hypertriglyceridemia   . Osteoarthritis     Past Surgical History:  Procedure Laterality Date  . COLONOSCOPY  01/02/2011   Procedure: COLONOSCOPY;  Surgeon: Jamesetta So;  [facility];  Service: Gastroenterology;  Laterality: N/A;  . LEFT HEART CATH AND CORONARY ANGIOGRAPHY N/A 02/18/2018   Procedure: LEFT HEART CATH AND CORONARY ANGIOGRAPHY;  Surgeon: Troy Sine, MD;  Location: [facility];  Service: Cardiovascular;  Laterality: N/A;  . LITHOTRIPSY  05/2009  . 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 allopurinol (ZYLOPRIM) 100 MG tablet Take 100 mg by mouth daily.   3  . aspirin 81 MG tablet Take 81 mg by mouth daily.      Marland Kitchen atenolol (TENORMIN) 25 MG tablet Take 25 mg by mouth daily.  11  . fish oil-omega-3 fatty acids 1000 MG capsule Take 2 g by mouth daily.      . lansoprazole (PREVACID) 30 MG capsule Take 30 mg by mouth daily.      Marland Kitchen losartan (COZAAR) 50 MG tablet Take 1 tablet (50 mg total) by mouth daily. 90 tablet 3  . nitroGLYCERIN (NITROSTAT) 0.4 MG SL tablet Place 1 tablet (0.4 mg total) under the tongue every 5 (five) minutes as needed for chest pain. 25 tablet 3  . pravastatin (PRAVACHOL) 80 MG tablet Take 80 mg by mouth daily.   3  . furosemide (LASIX) 40 MG tablet Take 1 tablet (40 mg total) by mouth daily. 90 tablet 3  . potassium chloride (K-DUR) 10 MEQ tablet Take 1 tablet (10 mEq total) by mouth daily. 90 tablet 3   No current facility-administered medications for this visit.    Allergies:  Bee venom   Social History: The patient  reports that he quit smoking about 40 years ago. His smoking use included cigarettes. He has never used smokeless tobacco. He reports current alcohol use of about 16.0 standard drinks of alcohol per week. He reports that he does not use drugs.  ROS:  Please see the history of present illness. Otherwise, complete review of systems is positive for none.  All other systems are reviewed and negative.   Physical Exam: VS:  BP 105/63   Pulse 69   Ht '5\' 9"'  (1.753 m)   Wt 198 lb 12.8 oz (90.2 kg)   SpO2 91%   BMI 29.36 kg/m , BMI Body mass index is 29.36 kg/m.  Wt Readings from Last 3 Encounters:  06/20/18 198 lb 12.8 oz (90.2 kg)  06/03/18 201 lb 6.4 oz (91.4 kg)  03/27/18 200 lb (90.7 kg)    General: Patient appears comfortable at rest. HEENT: Conjunctiva and lids normal, oropharynx clear. Neck: Supple, no elevated JVP or carotid bruits, no thyromegaly. Lungs: Decreased breath sounds at the bases, nonlabored breathing  at rest. Cardiac: Regular rate and rhythm, no S3, soft systolic murmur. Abdomen: Soft, nontender,  bowel sounds present. Extremities: Trace ankle edema, distal pulses 2+. Skin: Warm and dry. Musculoskeletal: No kyphosis. Neuropsychiatric: Alert and oriented x3, affect grossly appropriate.  ECG: I personally reviewed the tracing from 8/27/319 which showed sinus rhythm with incomplete right bundle branch block, left anterior fascicular block, diffuse nonspecific ST-T changes.  Recent Labwork: 02/13/2018: Hemoglobin 15.8; Platelets 228 02/17/2018: BUN 15; Creatinine, Ser 0.94; Potassium 4.6; Sodium 140  December 2019: Hemoglobin 14.9, platelets 288, troponin I 0.08, BUN 14, creatinine 1.09, potassium 4.9, AST 27, ALT 26  Other Studies Reviewed Today:  Chest CT 06/03/2018: FINDINGS: Cardiovascular: Satisfactory opacification of the pulmonary arteries to the segmental level. No evidence of pulmonary embolism. Normal heart size. No pericardial effusion.  Mediastinum/Nodes: Negative for adenopathy or mass  Lungs/Pleura: Moderate layering pleural effusions with mild atelectasis. Minimal interlobular septal thickening at the bases.  Upper Abdomen: Haziness of the visualized omentum/left upper quadrant mesentery presumably related to volume overload given the above. No superimposed nodularity is seen.  Musculoskeletal: Spondylosis.  No acute or aggressive finding  Review of the MIP images confirms the above findings.  IMPRESSION: 1. Moderate layering pleural effusions and minimal pulmonary edema. 2. Negative for pulmonary embolism.  Cardiac catheterization 02/18/2018: Normal epicardial coronary arteries dominant left circumflex artery circulation. No evidence for obstructive disease.  Elevated LVEDP at 27 mmHg in this patient with documented EF of 55 to 60% with moderate left ventricular hypertrophy and grade 3 diastolic dysfunction on echo Doppler assessment yesterday, February 17, 2018.  RECOMMENDATION: Medical therapy for diastolic dysfunction. Consider future cardiopulmonary met exercise testing if concern for microvascular etiology.  No indication for antiplatelet therapy at this time.  Echocardiogram 02/17/2018: Study Conclusions  - Left ventricle: The cavity size was normal. Wall thickness was increased in a pattern of moderate LVH. Systolic function was normal. The estimated ejection fraction was in the range of 55% to 60%. Wall motion was normal; there were no regional wall motion abnormalities. Doppler parameters are consistent with restrictive physiology, indicative of decreased left ventricular diastolic compliance and/or increased left atrial pressure. - Aortic valve: Mildly calcified annulus. Trileaflet; mildly calcified leaflets. Moderate calcification involving the noncoronary cusp. There was mild regurgitation. - Mitral valve: Moderately calcified annulus. There was trivial regurgitation. - Left atrium: The atrium was mildly to moderately dilated. - Right atrium: Central venous pressure (est): 3 mm Hg. - Atrial septum: No defect or patent foramen ovale was identified. - Tricuspid valve: There was trivial regurgitation. - Pulmonary arteries: PA peak pressure: 38 mm Hg (S). - Pericardium, extracardiac: There was no pericardial effusion.   Assessment and Plan:  1.  Shortness of breath and intermittent cough.  Recent chest CT showed moderate bilateral pleural effusions.  Cardiac work-up in August revealed normal LVEF with moderate LVH and restrictive diastolic filling pattern raising likelihood of diastolic heart failure.  He had no significant CAD at that time.  Plan is to stop HCTZ, start Lasix 40 mg daily with KCl 10 mEq daily, concurrently reduce Cozaar to 50 mg daily.  He will follow-up in a few weeks with BMET, TSH, and PA and lateral chest x-ray.  2.  Normal coronary arteries a cardiac catheterization in August of  this year.  3.  Essential hypertension, blood pressure currently low normal and he reports intermittent orthostatic dizziness.  Reducing Cozaar dose.  4.  Mixed hyperlipidemia on Pravachol.  Current medicines were reviewed with the patient today.   Orders Placed This Encounter  Procedures  . DG Chest 2 View  . Basic Metabolic Panel (BMET)  . TSH    Disposition: Follow-up in 2 weeks.  Signed, Satira Sark, MD, [facility] 06/20/2018 2:57 PM    [facility] at [facility] [address], [place], [place] [address] Phone: [phone]; Fax: [phone]

## 2018-06-20 NOTE — Patient Instructions (Signed)
Medication Instructions:  STOP HCTZ  DECREASE Losartan to 50 mg daily    START Lasix 40 mg daily  START Potassium 10 meq daily   If you need a refill on your cardiac medications before your next appointment, please call your pharmacy.   Lab work: Get lab work [employer], JUST BEFORE NEXT VISIT  If you have labs (blood work) drawn today and your tests are completely normal, you will receive your results only by: Marland Kitchen MyChart Message (if you have MyChart) OR . A paper copy in the mail If you have any lab test that is abnormal or we need to change your treatment, we will call you to review the results.  Testing/Procedures: Get Chest x-ray just before next visit- can do the morning of your apt  Follow-Up: Follow up in 2 weeks

## 2018-06-20 NOTE — Telephone Encounter (Signed)
06/20/18 Seen in office today, medications changed

## 2018-06-28 DIAGNOSIS — Z9889 Other specified postprocedural states: Secondary | ICD-10-CM | POA: Diagnosis not present

## 2018-07-07 ENCOUNTER — Other Ambulatory Visit (HOSPITAL_COMMUNITY)
Admission: RE | Admit: 2018-07-07 | Discharge: 2018-07-07 | Disposition: A | Discharge status: Home / Self Care | Payer: PPO | Source: Ambulatory Visit | Attending: Cardiology | Admitting: Cardiology

## 2018-07-07 ENCOUNTER — Ambulatory Visit (HOSPITAL_COMMUNITY)
Admission: RE | Admit: 2018-07-07 | Discharge: 2018-07-07 | Disposition: A | Discharge status: Home / Self Care | Payer: PPO | Source: Ambulatory Visit | Attending: Cardiology | Admitting: Cardiology

## 2018-07-07 ENCOUNTER — Ambulatory Visit: Payer: PPO | Admitting: Cardiology

## 2018-07-07 ENCOUNTER — Encounter: Payer: Self-pay | Admitting: Cardiology

## 2018-07-07 VITALS — BP 96/50 | HR 56 | Ht 69.0 in | Wt 198.0 lb

## 2018-07-07 DIAGNOSIS — I10 Essential (primary) hypertension: Secondary | ICD-10-CM | POA: Diagnosis not present

## 2018-07-07 DIAGNOSIS — R05 Cough: Secondary | ICD-10-CM | POA: Diagnosis not present

## 2018-07-07 DIAGNOSIS — R0609 Other forms of dyspnea: Secondary | ICD-10-CM | POA: Diagnosis not present

## 2018-07-07 DIAGNOSIS — J90 Pleural effusion, not elsewhere classified: Secondary | ICD-10-CM | POA: Diagnosis not present

## 2018-07-07 DIAGNOSIS — I5033 Acute on chronic diastolic (congestive) heart failure: Secondary | ICD-10-CM | POA: Diagnosis not present

## 2018-07-07 DIAGNOSIS — E782 Mixed hyperlipidemia: Secondary | ICD-10-CM | POA: Diagnosis not present

## 2018-07-07 DIAGNOSIS — Z0389 Encounter for observation for other suspected diseases and conditions ruled out: Secondary | ICD-10-CM

## 2018-07-07 DIAGNOSIS — IMO0001 Reserved for inherently not codable concepts without codable children: Secondary | ICD-10-CM

## 2018-07-07 LAB — BASIC METABOLIC PANEL
Anion gap: 9 (ref 5–15)
BUN: 25 mg/dL — AB (ref 8–23)
CALCIUM: 9.1 mg/dL (ref 8.9–10.3)
CO2: 26 mmol/L (ref 22–32)
CREATININE: 1.23 mg/dL (ref 0.61–1.24)
Chloride: 105 mmol/L (ref 98–111)
GFR calc Af Amer: >60 mL/min (ref >60)
GFR calc non Af Amer: 58 mL/min — ABNORMAL LOW (ref >60)
GLUCOSE: 89 mg/dL (ref 70–99)
Potassium: 4.1 mmol/L (ref 3.5–5.1)
Sodium: 140 mmol/L (ref 135–145)

## 2018-07-07 LAB — TSH: TSH: 3.653 u[IU]/mL (ref 0.350–4.500)

## 2018-07-07 MED ORDER — LOSARTAN POTASSIUM 25 MG PO TABS: 25.0000 mg | ORAL_TABLET | Freq: Every day | ORAL | 3 refills | Status: DC

## 2018-07-07 NOTE — Patient Instructions (Signed)
Medication Instructions:  Decrease losartan to 25 mg daily   Labwork: none  Testing/Procedures: none  Follow-Up: Your physician recommends that you schedule a follow-up appointment in:  To be determined    Any Other Special Instructions Will Be Listed Below (If Applicable).  You have been referred to [facility].    If you need a refill on your cardiac medications before your next appointment, please call your pharmacy.

## 2018-07-07 NOTE — Progress Notes (Signed)
Cardiology Office Note  Date: 07/07/2018   ID: Adam Lewis 1946-02-24, MRN [id number]  PCP: Redmond School, MD  Primary Cardiologist: Rozann Lesches, MD   Chief Complaint  Patient presents with  . Shortness of Breath    History of Present Illness: Adam Lewis is a 73 y.o. male seen recently in late December 2019, history discussed in the prior note.  He presents for a close follow-up visit.  Since our last encounter I have had him on Lasix, HCTZ which had been started by PCP was discontinued.  Chest CT ordered by PCP had demonstrated bilateral pleural effusions and we were managing him for suspected acute on chronic diastolic heart failure based on previous work-up.  He has normal coronary arteries and his echocardiogram from August 2019 demonstrated moderate LVH with restrictive diastolic filling pattern, PASP 38 mmHg.  Follow-up chest x-ray today shows interstitial densities that are stable and possibly reflective of interstitial edema, also stable bilateral pleural effusions.  He has not had any significant improvement in terms of shortness of breath as well as intermittent orthopnea.  Also having some leg edema and using compression stockings.  His weight has not changed significantly.  Follow-up BMET shows creatinine 1.2 and potassium 4.1.  Today I talked with the patient and his wife about my concerns.  He is not having an expected clinical response on diuretic, could potentially be an underlying inflammatory process.  I would start to wonder about an infiltrative cardiomyopathy as well.  He also tells me that he was in the [employer] and also worked in a Civil engineer, contracting, he has had concerns about asbestos exposure but never had any previous concerning findings.  Does not report fevers or chills to suggest pneumonia.  Still has an intermittent nonproductive cough.  It is somewhat better however.  Past Medical History:  Diagnosis Date  . Essential hypertension   . GERD  (gastroesophageal reflux disease)   . Hypertriglyceridemia   . Osteoarthritis     Past Surgical History:  Procedure Laterality Date  . CARPAL TUNNEL RELEASE    . COLONOSCOPY  01/02/2011   Procedure: COLONOSCOPY;  Surgeon: Jamesetta So;  Location: [facility];  Service: Gastroenterology;  Laterality: N/A;  . LEFT HEART CATH AND CORONARY ANGIOGRAPHY N/A 02/18/2018   Procedure: LEFT HEART CATH AND CORONARY ANGIOGRAPHY;  Surgeon: Troy Sine, MD;  Location: [facility];  Service: Cardiovascular;  Laterality: N/A;  . LITHOTRIPSY  05/2009  . 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 allopurinol (ZYLOPRIM) 100 MG tablet Take 100 mg by mouth daily.   3  . aspirin 81 MG tablet Take 81 mg by mouth daily.      Marland Kitchen atenolol (TENORMIN) 25 MG tablet Take 25 mg by mouth daily.  11  . fish oil-omega-3 fatty acids 1000 MG capsule Take 2 g by mouth daily.      . furosemide (LASIX) 40 MG tablet Take 1 tablet (40 mg total) by mouth daily. 90 tablet 3  . lansoprazole (PREVACID) 30 MG capsule Take 30 mg by mouth daily.      . nitroGLYCERIN (NITROSTAT) 0.4 MG SL tablet Place 1 tablet (0.4 mg total) under the tongue every 5 (five) minutes as needed for chest pain. 25 tablet 3  . potassium chloride (K-DUR) 10 MEQ tablet Take 1 tablet (10 mEq total) by mouth daily. 90 tablet 3  . pravastatin (  PRAVACHOL) 80 MG tablet Take 80 mg by mouth daily.   3  . losartan (COZAAR) 25 MG tablet Take 1 tablet (25 mg total) by mouth daily. 90 tablet 3   No current facility-administered medications for this visit.    Allergies:  Bee venom   Social History: The patient  reports that he quit smoking about 41 years ago. His smoking use included cigarettes. He has never used smokeless tobacco. He reports current alcohol use of about 16.0 standard drinks of alcohol per week. He reports that he does not use drugs.   ROS:  Please see the history of  present illness. Otherwise, complete review of systems is positive for none.  All other systems are reviewed and negative.   Physical Exam: VS:  BP (!) 96/50   Pulse (!) 56   Ht _0  (1.753 m)   Wt 198 lb (89.8 kg)   SpO2 98%   BMI 29.24 kg/m , BMI Body mass index is 29.24 kg/m.  Wt Readings from Last 3 Encounters:  07/07/18 198 lb (89.8 kg)  06/20/18 198 lb 12.8 oz (90.2 kg)  06/03/18 201 lb 6.4 oz (91.4 kg)    General: Patient appears comfortable at rest. HEENT: Conjunctiva and lids normal, oropharynx clear. Neck: Supple, elevated JVP, no carotid bruits, no thyromegaly. Lungs: Decreased basilar breath sounds, no wheezing, nonlabored breathing at rest. Cardiac: Regular rate and rhythm, S4, no S3, soft systolic murmur, no pericardial rub. Abdomen: Soft, nontender, bowel sounds present, no guarding or rebound. Extremities: 1+ right lower leg edema, distal pulses 2+. Skin: Warm and dry. Musculoskeletal: No kyphosis. Neuropsychiatric: Alert and oriented x3, affect grossly appropriate.  ECG: I personally reviewed the tracing from 02/18/2018 which showed sinus rhythm with incomplete right bundle branch block, left anterior fascicular block, diffuse nonspecific ST-T changes.  Recent Labwork: 02/13/2018: Hemoglobin 15.8; Platelets 228 07/07/2018: BUN 25; Creatinine, Ser 1.23; Potassium 4.1; Sodium 140   Other Studies Reviewed Today:  Chest x-ray 07/07/2018: FINDINGS: The heart size and mediastinal contours are within normal limits. Stable interstitial densities are noted throughout both lungs which may represent pulmonary edema. Increased bibasilar atelectasis is noted with associated pleural effusions. No pneumothorax is noted. The visualized skeletal structures are unremarkable.  IMPRESSION: Stable interstitial densities are noted concerning for possible pulmonary edema. Increased bibasilar atelectasis is noted with associated pleural effusions.  Cardiac catheterization  02/18/2018: Normal epicardial coronary arteries dominant left circumflex artery circulation. No evidence for obstructive disease.  Elevated LVEDP at 27 mmHg in this patient with documented EF of 55 to 60% with moderate left ventricular hypertrophy and grade 3 diastolic dysfunction on echo Doppler assessment yesterday, February 17, 2018.  RECOMMENDATION: Medical therapy for diastolic dysfunction. Consider future cardiopulmonary met exercise testing if concern for microvascular etiology.  No indication for antiplatelet therapy at this time.  Echocardiogram 02/17/2018: Study Conclusions  - Left ventricle: The cavity size was normal. Wall thickness was increased in a pattern of moderate LVH. Systolic function was normal. The estimated ejection fraction was in the range of 55% to 60%. Wall motion was normal; there were no regional wall motion abnormalities. Doppler parameters are consistent with restrictive physiology, indicative of decreased left ventricular diastolic compliance and/or increased left atrial pressure. - Aortic valve: Mildly calcified annulus. Trileaflet; mildly calcified leaflets. Moderate calcification involving the noncoronary cusp. There was mild regurgitation. - Mitral valve: Moderately calcified annulus. There was trivial regurgitation. - Left atrium: The atrium was mildly to moderately dilated. - Right atrium: Central venous pressure (est):  3 mm Hg. - Atrial septum: No defect or patent foramen ovale was identified. - Tricuspid valve: There was trivial regurgitation. - Pulmonary arteries: PA peak pressure: 38 mm Hg (S). - Pericardium, extracardiac: There was no pericardial effusion.   Assessment and Plan:  1.  Dyspnea on exertion and fatigue in a 73 year old male with a history of hypertension and mixed hyperlipidemia.  He underwent cardiac catheterization in August 2019 which demonstrated normal coronary arteries.  He did have an elevated  LVEDP and echocardiography around that time showed moderate LVH with grade 3 diastolic dysfunction.  More recently he has had documentation of pleural effusions and interstitial edema, reports orthopnea and intermittent nonproductive cough at this point, no fevers or chills.  He was started on Lasix and has not manifested much clinical improvement with follow-up chest x-ray showing persisting small to moderate bilateral pleural effusions and interstitial edema pattern.  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  My plan at this time is to refer him to the Advanced Heart Failure clinic for further thoughtful evaluation.  Rather than ordering a number of follow-up imaging studies and serologies, I will have this work-up coordinated by them.  I wonder whether he needs further assessment for potential infiltrative cardiomyopathy, possibly also a right heart catheterization.  A diagnostic thoracentesis may also be reasonable if other noninvasive studies and serologies are unrevealing.  For now I will have him continue Lasix with potassium supplements, Cozaar will be cut back to 25 mg daily.  Continue beta-blocker as well.  2.  Essential hypertension by history.  Cozaar is being reduced further with lower blood pressure today.  3.  Mixed hyperlipidemia, on Pravachol and omega-3 supplements.  Current medicines were reviewed with the patient today.   Orders Placed This Encounter  Procedures  . AMB referral to CHF clinic    Disposition: Referral to Heart Failure clinic.  Signed, Satira Sark, MD, [facility] 07/07/2018 3:10 PM    [place] [facility] [address] [place], [place] [address] Phone: [phone]; Fax: [phone]

## 2018-07-16 ENCOUNTER — Ambulatory Visit: Payer: PPO

## 2018-07-16 ENCOUNTER — Telehealth: Payer: Self-pay | Admitting: Cardiology

## 2018-07-16 NOTE — Telephone Encounter (Signed)
Pt would like to know when his next follow up will be since he's not being seen by the [facility]

## 2018-07-17 NOTE — Telephone Encounter (Signed)
Spoke with scheduler in the [facility]. Informed that they are only taking pt's with EF below 30% at this time. Informed her that Dr. Domenic Polite has spoken with Dr. Haroldine Laws regarding this pt. She speak with Dr. Haroldine Laws regarding this to see if she can get pt scheduled.

## 2018-07-17 NOTE — Telephone Encounter (Signed)
I am not certain about what was communicated to him regarding follow-up for a Heart Failure evaluation.  I have communicated with Dr. Haroldine Laws about seeing him and my understanding is that an office consultation is being scheduled.  If you could check back with the Heart Failure office later today that might be helpful.

## 2018-07-17 NOTE — Telephone Encounter (Signed)
During nurse visit yesterday the pt mentioned that the Heart Failure Team advised him that he would not be coming to see them as he is not in need of their services. So, he wanted to know when he will need to follow up with you. Please advise.

## 2018-07-22 ENCOUNTER — Encounter (HOSPITAL_COMMUNITY): Payer: Self-pay | Admitting: Internal Medicine

## 2018-07-22 ENCOUNTER — Ambulatory Visit (HOSPITAL_COMMUNITY)
Admission: RE | Admit: 2018-07-22 | Discharge: 2018-07-22 | Disposition: A | Discharge status: Home / Self Care | Payer: PPO | Source: Ambulatory Visit | Attending: Internal Medicine | Admitting: Internal Medicine

## 2018-07-22 ENCOUNTER — Other Ambulatory Visit: Payer: Self-pay

## 2018-07-22 VITALS — BP 116/68 | HR 79 | Wt 200.6 lb

## 2018-07-22 DIAGNOSIS — Z79899 Other long term (current) drug therapy: Secondary | ICD-10-CM | POA: Insufficient documentation

## 2018-07-22 DIAGNOSIS — Z91030 Bee allergy status: Secondary | ICD-10-CM | POA: Insufficient documentation

## 2018-07-22 DIAGNOSIS — Z7982 Long term (current) use of aspirin: Secondary | ICD-10-CM | POA: Insufficient documentation

## 2018-07-22 DIAGNOSIS — Z87891 Personal history of nicotine dependence: Secondary | ICD-10-CM | POA: Diagnosis not present

## 2018-07-22 DIAGNOSIS — J90 Pleural effusion, not elsewhere classified: Secondary | ICD-10-CM

## 2018-07-22 DIAGNOSIS — M1990 Unspecified osteoarthritis, unspecified site: Secondary | ICD-10-CM | POA: Insufficient documentation

## 2018-07-22 DIAGNOSIS — I10 Essential (primary) hypertension: Secondary | ICD-10-CM | POA: Diagnosis not present

## 2018-07-22 DIAGNOSIS — E782 Mixed hyperlipidemia: Secondary | ICD-10-CM | POA: Insufficient documentation

## 2018-07-22 DIAGNOSIS — Z8249 Family history of ischemic heart disease and other diseases of the circulatory system: Secondary | ICD-10-CM | POA: Diagnosis not present

## 2018-07-22 DIAGNOSIS — I509 Heart failure, unspecified: Secondary | ICD-10-CM

## 2018-07-22 DIAGNOSIS — R0609 Other forms of dyspnea: Secondary | ICD-10-CM | POA: Insufficient documentation

## 2018-07-22 DIAGNOSIS — K219 Gastro-esophageal reflux disease without esophagitis: Secondary | ICD-10-CM | POA: Insufficient documentation

## 2018-07-22 LAB — COMPREHENSIVE METABOLIC PANEL
ALBUMIN: 3.6 g/dL (ref 3.5–5.0)
ALK PHOS: 50 U/L (ref 38–126)
ALT: 25 U/L (ref 0–44)
AST: 26 U/L (ref 15–41)
Anion gap: 10 (ref 5–15)
BILIRUBIN TOTAL: 1.2 mg/dL (ref 0.3–1.2)
BUN: 15 mg/dL (ref 8–23)
CO2: 27 mmol/L (ref 22–32)
Calcium: 9.3 mg/dL (ref 8.9–10.3)
Chloride: 102 mmol/L (ref 98–111)
Creatinine, Ser: 1.09 mg/dL (ref 0.61–1.24)
GFR calc Af Amer: >60 mL/min (ref >60)
GFR calc non Af Amer: >60 mL/min (ref >60)
GLUCOSE: 103 mg/dL — AB (ref 70–99)
POTASSIUM: 4.4 mmol/L (ref 3.5–5.1)
SODIUM: 139 mmol/L (ref 135–145)
Total Protein: 6.3 g/dL — ABNORMAL LOW (ref 6.5–8.1)

## 2018-07-22 LAB — BRAIN NATRIURETIC PEPTIDE: B Natriuretic Peptide: 1217.2 pg/mL — ABNORMAL HIGH (ref 0.0–100.0)

## 2018-07-22 LAB — SEDIMENTATION RATE: Sed Rate: 1 mm/hr (ref 0–16)

## 2018-07-22 MED ORDER — POTASSIUM CHLORIDE ER 20 MEQ PO TBCR: 20.0000 meq | EXTENDED_RELEASE_TABLET | Freq: Every day | ORAL | 3 refills | Status: DC

## 2018-07-22 MED ORDER — TORSEMIDE 20 MG PO TABS: 40.0000 mg | ORAL_TABLET | Freq: Every day | ORAL | 4 refills | Status: DC

## 2018-07-22 NOTE — Progress Notes (Signed)
ReDS Vest - 07/22/18 1100      ReDS Vest   MR   No    Estimated volume prior to reading  Med    Fitting Posture  Sitting    Height Marker  Short    Ruler Value  32    Center Strip  Aligned    ReDS Value  51

## 2018-07-22 NOTE — Patient Instructions (Signed)
Labs were done today. We will call you with any ABNORMAL results. No news is good news!  STOP taking Lasix  BEGIN taking Torsemide 40mg  (2 tab) each day.   STOP taking Atenolol.  INCREASE Potassium to 20mg  (1 tab) each day.   Your physician recommends you compete a MRI, we will call you to schedule this appointment.   Your physician recommends you complete a Pulmomary Functions Test in 2-3 weeks.   You have been referred to Internal Radiology for a possible Thoracentesis, they will call you to schedule this appointment with you.

## 2018-07-22 NOTE — Consult Note (Signed)
ADVANCED HF CLINIC CONSULT NOTE  Referring Physician: Domenic Polite Primary Cardiologist: Domenic Polite  HPI:  Adam Lewis is a 73 y.o. male with a history of hypertension and mixed hyperlipidemia referred by Dr. Domenic Polite for evaluation of progressive dyspnea on exertion.  Says dyspnea began about a year which he noticed while walking the dog. No associated CP.   Echo 8/19 EF 55-60% with moderate LVH restrictive physiology. RVSP 73mHG. He underwent left heart cardiac catheterization in August 2019 which demonstrated normal coronary arteries.  LVEDP 27.   Had CT scan in 12/19. Which showed moderate layering pleural effusions and minimal pulmonary edema. Negative for pulmonary embolism.  He was placed on lasix 435mdaily without any real response. SBP has been running 96-115 and losartan was cut back.  Recently had severe carpal tunnel syndrome and was operated on the R. Also has mild tingling on left Weight has not changed. No edema. No orthopnea or PND.   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  Does not report fevers or chills to suggest pneumonia.  Still has an intermittent nonproductive cough. He reports remote tick bite as well as intermittent problems with severe sinus drainage. No arthralgias or rash.   Last CXR 07/07/18 Stable interstitial densities are noted concerning for possible pulmonary edema. Increased bibasilar atelectasis is noted with Associated small to moderate L>R pleural effusions.    Review of Systems: [y] = yes, '[ ]'  = no   General: Weight gain '[ ]' ; Weight loss '[ ]' ; Anorexia '[ ]' ; Fatigue [yBlue.Reese; Fever '[ ]' ; Chills '[ ]' ; Weakness '[ ]'   Cardiac: Chest pain/pressure '[ ]' ; Resting SOB [ y]; Exertional SOB [ y]; Orthopnea [yBlue.Reese; Pedal Edema [ y]; Palpitations '[ ]' ; Syncope '[ ]' ; Presyncope '[ ]' ; Paroxysmal  nocturnal dyspnea'[ ]'   Pulmonary: Cough [ y]; Wheezing'[ ]' ; Hemoptysis'[ ]' ; Sputum '[ ]' ; Snoring '[ ]'   GI: Vomiting'[ ]' ; Dysphagia'[ ]' ; Melena'[ ]' ; Hematochezia '[ ]' ; Heartburn'[ ]' ; Abdominal pain '[ ]' ; Constipation '[ ]' ; Diarrhea '[ ]' ; BRBPR '[ ]'   GU: Hematuria'[ ]' ; Dysuria '[ ]' ; Nocturia'[ ]'   Vascular: Pain in legs with walking '[ ]' ; Pain in feet with lying flat '[ ]' ; Non-healing sores '[ ]' ; Stroke '[ ]' ; TIA '[ ]' ; Slurred speech '[ ]' ;  Neuro: Headaches'[ ]' ; Vertigo'[ ]' ; Seizures'[ ]' ; Paresthesias'[ ]' ;Blurred vision '[ ]' ; Diplopia '[ ]' ; Vision changes '[ ]'   Ortho/Skin: Arthritis '[ ]' ; Joint pain '[ ]' ; Muscle pain '[ ]' ; Joint swelling '[ ]' ; Back Pain '[ ]' ; Rash '[ ]'   Psych: Depression'[ ]' ; Anxiety'[ ]'   Heme: Bleeding problems '[ ]' ; Clotting disorders '[ ]' ; Anemia '[ ]'   Endocrine: Diabetes '[ ]' ; Thyroid dysfunction'[ ]'    Past Medical History:  Diagnosis Date  . Essential hypertension   . GERD (gastroesophageal reflux disease)   . Hypertriglyceridemia   . Osteoarthritis     Current Outpatient Medications  Medication Sig Dispense Refill  . allopurinol (ZYLOPRIM) 100 MG tablet Take 100 mg by mouth daily.   3  . aspirin 81 MG tablet Take 81 mg by mouth daily.      . Marland Kitchentenolol (TENORMIN) 25 MG tablet Take 25 mg by mouth daily.  11  . fish oil-omega-3 fatty acids 1000 MG capsule Take 2 g by mouth daily.      .Marland Kitchen  furosemide (LASIX) 40 MG tablet Take 1 tablet (40 mg total) by mouth daily. 90 tablet 3  . lansoprazole (PREVACID) 30 MG capsule Take 30 mg by mouth daily.      Marland Kitchen losartan (COZAAR) 25 MG tablet Take 1 tablet (25 mg total) by mouth daily. 90 tablet 3  . nitroGLYCERIN (NITROSTAT) 0.4 MG SL tablet Place 1 tablet (0.4 mg total) under the tongue every 5 (five) minutes as needed for chest pain. 25 tablet 3  . potassium chloride (K-DUR) 10 MEQ tablet Take 1 tablet (10 mEq total) by mouth daily. 90 tablet 3  . pravastatin (PRAVACHOL) 80 MG tablet Take 80 mg by mouth daily.   3   No current facility-administered medications for this  encounter.     Allergies  Allergen Reactions  . Bee Venom Hives      Social History   Socioeconomic History  . Marital status: Married    Spouse name: Not on file  . Number of children: Not on file  . Years of education: Not on file  . Highest education level: Not on file  Occupational History  . Not on file  Social Needs  . Financial resource strain: Not on file  . Food insecurity:    Worry: Not on file    Inability: Not on file  . Transportation needs:    Medical: Not on file    Non-medical: Not on file  Tobacco Use  . Smoking status: Former Smoker    Types: Cigarettes    Last attempt to quit: 07/16/1977    Years since quitting: 41.0  . Smokeless tobacco: Never Used  Substance and Sexual Activity  . Alcohol use: Yes    Alcohol/week: 16.0 standard drinks    Types: 1 Glasses of wine, 14 Cans of beer, 1 Shots of liquor per week  . Drug use: No  . Sexual activity: Not on file  Lifestyle  . Physical activity:    Days per week: Not on file    Minutes per session: Not on file  . Stress: Not on file  Relationships  . Social connections:    Talks on phone: Not on file    Gets together: Not on file    Attends religious service: Not on file    Active member of club or organization: Not on file    Attends meetings of clubs or organizations: Not on file    Relationship status: Not on file  . Intimate partner violence:    Fear of current or ex partner: Not on file    Emotionally abused: Not on file    Physically abused: Not on file    Forced sexual activity: Not on file  Other Topics Concern  . Not on file  Social History Narrative  . Not on file      Family History  Problem Relation Age of Onset  . Hypertension Father   . Heart disease Father     Vitals:   07/22/18 1050  BP: 116/68  Pulse: 79  SpO2: 95%  Weight: 91 kg (200 lb 9.6 oz)    PHYSICAL EXAM: General:  Well appearing. No respiratory difficulty HEENT: normal Neck: supple. JVP 8-9. Carotids  2+ bilat; no bruits. No lymphadenopathy or thryomegaly appreciated. Cor: PMI nondisplaced. Regular rate & rhythm. No rubs, gallops or murmurs. Lungs: clear. Decreased at bases   Abdomen: soft, nontender, nondistended. No hepatosplenomegaly. No bruits or masses. Good bowel sounds. Extremities: no cyanosis, clubbing, rash, 1+ edema Neuro: alert &  oriented x 3, cranial nerves grossly intact. moves all 4 extremities w/o difficulty. Affect pleasant.  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  Dyspnea on exertion - Interesting and challenging case. I have reviewed echo, cath films, chest CT, CXR and ECG images all personally - He is quite symptomatic with NYHA III symptoms. Volume status appears elevated on exam  - From a high-level I think the differential diagnosis here is restrictive CM with diastolic HF vs pneumonitis.  - Given LVH, restrictive physiology, elevated ReDS reading, carpal tunnel syndrome, falling BP (? autonomic neuropathy) and conduction abnormalities (volatge ok) I agree with Dr. 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  - We will plan the following: - Change lasix to torsemide 40 daily - Increase KCL to 20 daily - Stop atenolol - cMRI to look for amyloid and other infiltrative processes - ESR, ANA, ANCA, RF, Lomax-70, myeloma panel, BNP - IR for possible thoracentesis  - PFTs with DLCO (can wait until after diuresis) - If no response RHC next week  - See back in 1 week for f/u and further decision making  Total time spent 45 minutes. Over half that time spent discussing above.   Glori Bickers, MD  1:49 PM

## 2018-07-23 LAB — ANA: Anti Nuclear Antibody(ANA): NEGATIVE

## 2018-07-23 LAB — RHEUMATOID FACTOR: Rhuematoid fact SerPl-aCnc: <10 IU/mL (ref 0.0–13.9)

## 2018-07-23 LAB — ANTI-SCLERODERMA ANTIBODY

## 2018-07-25 LAB — MULTIPLE MYELOMA PANEL, SERUM
ALBUMIN/GLOB SERPL: 1.5 (ref 0.7–1.7)
ALPHA 1: 0.3 g/dL (ref 0.0–0.4)
Albumin SerPl Elph-Mcnc: 3.5 g/dL (ref 2.9–4.4)
Alpha2 Glob SerPl Elph-Mcnc: 0.7 g/dL (ref 0.4–1.0)
B-GLOBULIN SERPL ELPH-MCNC: 0.9 g/dL (ref 0.7–1.3)
GAMMA GLOB SERPL ELPH-MCNC: 0.6 g/dL (ref 0.4–1.8)
GLOBULIN, TOTAL: 2.5 g/dL (ref 2.2–3.9)
IGG (IMMUNOGLOBIN G), SERUM: 727 mg/dL (ref 700–1600)
IgA: 52 mg/dL — ABNORMAL LOW (ref 61–437)
IgM (Immunoglobulin M), Srm: 18 mg/dL (ref 15–143)
Total Protein ELP: 6 g/dL (ref 6.0–8.5)

## 2018-07-30 ENCOUNTER — Encounter (HOSPITAL_COMMUNITY): Payer: Self-pay | Admitting: Internal Medicine

## 2018-07-30 ENCOUNTER — Ambulatory Visit (HOSPITAL_COMMUNITY)
Admission: RE | Admit: 2018-07-30 | Discharge: 2018-07-30 | Disposition: A | Discharge status: Home / Self Care | Payer: PPO | Source: Ambulatory Visit | Attending: Internal Medicine | Admitting: Internal Medicine

## 2018-07-30 VITALS — BP 110/74 | HR 88 | Wt 191.0 lb

## 2018-07-30 DIAGNOSIS — I5032 Chronic diastolic (congestive) heart failure: Secondary | ICD-10-CM | POA: Diagnosis not present

## 2018-07-30 DIAGNOSIS — Z87891 Personal history of nicotine dependence: Secondary | ICD-10-CM | POA: Insufficient documentation

## 2018-07-30 DIAGNOSIS — E782 Mixed hyperlipidemia: Secondary | ICD-10-CM | POA: Insufficient documentation

## 2018-07-30 DIAGNOSIS — J90 Pleural effusion, not elsewhere classified: Secondary | ICD-10-CM | POA: Diagnosis not present

## 2018-07-30 DIAGNOSIS — R0600 Dyspnea, unspecified: Secondary | ICD-10-CM

## 2018-07-30 DIAGNOSIS — K219 Gastro-esophageal reflux disease without esophagitis: Secondary | ICD-10-CM | POA: Diagnosis not present

## 2018-07-30 DIAGNOSIS — R0609 Other forms of dyspnea: Secondary | ICD-10-CM | POA: Insufficient documentation

## 2018-07-30 DIAGNOSIS — Z8249 Family history of ischemic heart disease and other diseases of the circulatory system: Secondary | ICD-10-CM | POA: Insufficient documentation

## 2018-07-30 DIAGNOSIS — M1990 Unspecified osteoarthritis, unspecified site: Secondary | ICD-10-CM | POA: Insufficient documentation

## 2018-07-30 DIAGNOSIS — I110 Hypertensive heart disease with heart failure: Secondary | ICD-10-CM | POA: Diagnosis not present

## 2018-07-30 DIAGNOSIS — Z79899 Other long term (current) drug therapy: Secondary | ICD-10-CM | POA: Insufficient documentation

## 2018-07-30 DIAGNOSIS — Z7982 Long term (current) use of aspirin: Secondary | ICD-10-CM | POA: Insufficient documentation

## 2018-07-30 DIAGNOSIS — Z91030 Bee allergy status: Secondary | ICD-10-CM | POA: Insufficient documentation

## 2018-07-30 DIAGNOSIS — I425 Other restrictive cardiomyopathy: Secondary | ICD-10-CM | POA: Diagnosis not present

## 2018-07-30 DIAGNOSIS — R05 Cough: Secondary | ICD-10-CM | POA: Diagnosis not present

## 2018-07-30 LAB — BASIC METABOLIC PANEL
ANION GAP: 11 (ref 5–15)
BUN: 19 mg/dL (ref 8–23)
CALCIUM: 9.3 mg/dL (ref 8.9–10.3)
CO2: 27 mmol/L (ref 22–32)
Chloride: 99 mmol/L (ref 98–111)
Creatinine, Ser: 1.21 mg/dL (ref 0.61–1.24)
GFR calc Af Amer: >60 mL/min (ref >60)
GFR calc non Af Amer: 59 mL/min — ABNORMAL LOW (ref >60)
GLUCOSE: 115 mg/dL — AB (ref 70–99)
Potassium: 4.2 mmol/L (ref 3.5–5.1)
Sodium: 137 mmol/L (ref 135–145)

## 2018-07-30 LAB — BRAIN NATRIURETIC PEPTIDE: B Natriuretic Peptide: 928.8 pg/mL — ABNORMAL HIGH (ref 0.0–100.0)

## 2018-07-30 NOTE — Patient Instructions (Signed)
Labs done today. We will contact you with any abnormal lab work.  A chest x-ray takes a picture of the organs and structures inside the chest, including the heart, lungs, and blood vessels. This test can show several things, including, whether the heart is enlarges; whether fluid is building up in the lungs; and whether pacemaker / defibrillator leads are still in place.   Follow up with Dr. Haroldine Laws in 4 weeks

## 2018-07-30 NOTE — Progress Notes (Signed)
Advanced Heart Failure Clinic Note   Referring Physician: Dr. Domenic Polite PCP: Redmond School, MD PCP-Cardiologist: Rozann Lesches, MD   HPI:  Adam Lewis is a 73 y.o. male with a history of hypertension and mixed hyperlipidemia initially referred by Dr. Domenic Polite for evaluation of progressive dyspnea on exertion for about a year, that he noticed while walking the dog.   Echo 8/19 EF 55-60% with moderate LVH restrictive physiology. RVSP 9mHG. He underwent left heart cardiac catheterization in August 2019 which demonstrated normal coronary arteries. LVEDP 27.   Had CT scan in 12/19. Which showed moderate layering pleural effusions and minimal pulmonary edema. Negative for pulmonary embolism. Has history of Carpal tunnel syndrome with operation on R. Has mild tingling on left.   He presents today for follow up. At last visit ReDs vest 51%. BNP 1217. Changed to torsemide and atenolol stopped.  Feels much better. Lost 10 pounds. Breathing much better. No orthopnea or PND. Able to walk to mailbox without problem. Leg edema improved.   SPEP/UPEP slight lambda chain presence but no M-spike, serologic w/u negative. ESR = 1   Last CXR 07/07/18 Stable interstitial densities are noted concerning for possible pulmonary edema. Increased bibasilar atelectasis is noted with Associated small to moderate L>R pleural effusions.  Review of systems complete and found to be negative unless listed in HPI.    Social History - Stopped smoking in his 267s Served in [employer] and worked in a [employer] engineer, contracting   Past Medical History:  Diagnosis Date  . Essential hypertension   . GERD (gastroesophageal reflux disease)   . Hypertriglyceridemia   . Osteoarthritis     Current Outpatient Medications  Medication Sig Dispense Refill  . allopurinol (ZYLOPRIM) 100 MG tablet Take 100 mg by mouth daily.   3  . aspirin 81 MG tablet Take 81 mg by mouth daily.      . fish oil-omega-3 fatty acids 1000 MG  capsule Take 2 g by mouth daily.      . lansoprazole (PREVACID) 30 MG capsule Take 30 mg by mouth daily.      .Marland Kitchenlosartan (COZAAR) 25 MG tablet Take 1 tablet (25 mg total) by mouth daily. 90 tablet 3  . nitroGLYCERIN (NITROSTAT) 0.4 MG SL tablet Place 1 tablet (0.4 mg total) under the tongue every 5 (five) minutes as needed for chest pain. 25 tablet 3  . potassium chloride 20 MEQ TBCR Take 20 mEq by mouth daily. 90 tablet 3  . pravastatin (PRAVACHOL) 80 MG tablet Take 80 mg by mouth daily.   3  . torsemide (DEMADEX) 20 MG tablet Take 2 tablets (40 mg total) by mouth daily. 60 tablet 4   No current facility-administered medications for this encounter.     Allergies  Allergen Reactions  . Bee Venom Hives      Social History   Socioeconomic History  . Marital status: Married    Spouse name: Not on file  . Number of children: Not on file  . Years of education: Not on file  . Highest education level: Not on file  Occupational History  . Not on file  Social Needs  . Financial resource strain: Not on file  . Food insecurity:    Worry: Not on file    Inability: Not on file  . Transportation needs:    Medical: Not on file    Non-medical: Not on file  Tobacco Use  . Smoking status: Former Smoker    Types: Cigarettes  Last attempt to quit: 07/16/1977    Years since quitting: 41.0  . Smokeless tobacco: Never Used  Substance and Sexual Activity  . Alcohol use: Yes    Alcohol/week: 16.0 standard drinks    Types: 1 Glasses of wine, 14 Cans of beer, 1 Shots of liquor per week  . Drug use: No  . Sexual activity: Not on file  Lifestyle  . Physical activity:    Days per week: Not on file    Minutes per session: Not on file  . Stress: Not on file  Relationships  . Social connections:    Talks on phone: Not on file    Gets together: Not on file    Attends religious service: Not on file    Active member of club or organization: Not on file    Attends meetings of clubs or  organizations: Not on file    Relationship status: Not on file  . Intimate partner violence:    Fear of current or ex partner: Not on file    Emotionally abused: Not on file    Physically abused: Not on file    Forced sexual activity: Not on file  Other Topics Concern  . Not on file  Social History Narrative  . Not on file    Family History  Problem Relation Age of Onset  . Hypertension Father   . Heart disease Father    Vitals:   07/30/18 1058  BP: 110/74  Pulse: 88  SpO2: 95%  Weight: 86.6 kg (191 lb)     Wt Readings from Last 3 Encounters:  07/30/18 86.6 kg (191 lb)  07/22/18 91 kg (200 lb 9.6 oz)  07/07/18 89.8 kg (198 lb)    PHYSICAL EXAM: General:  Well appearing. No respiratory difficulty HEENT: normal Neck: supple. JVP 8-9 Carotids 2+ bilat; no bruits. No lymphadenopathy or thyromegaly appreciated. Cor: PMI nondisplaced. Regular rate & rhythm. No rubs, +s4 or murmurs. Lungs: clear Abdomen: soft, nontender, nondistended. No hepatosplenomegaly. No bruits or masses. Good bowel sounds. Extremities: no cyanosis, clubbing, rash, trace edema Neuro: alert & oriented x 3, cranial nerves grossly intact. moves all 4 extremities w/o difficulty. Affect pleasant.  ECG: NSR 72 with iRBBB. LAFB, personally reviewed.   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 cMRI scheduled for 2/20 - Much improved NYHA II symptoms - Volume status better. ReDS 34% (down from 51%) - Continue torsemide 40 mg daily. Can cut back to 18m daily as needed - Continue KCl 20 mg daily. Labs today  - ESR, ANA, ANCA, RF, [place]-70, myeloma panel unremarkable. - PFTs with DLCO pending.  - Possible RHC in future  2. Pleural effusion - much improved on exam after  diuresis. - will repeat CXR  DGlori Bickers MD  12:08 PM

## 2018-07-30 NOTE — Progress Notes (Signed)
ReDS Vest - 07/30/18 1100      ReDS Vest   Estimated volume prior to reading  Med    Fitting Posture  Sitting    Height Marker  Tall    Ruler Value  32    Center Strip  Aligned    ReDS Value  34

## 2018-08-01 ENCOUNTER — Telehealth (HOSPITAL_COMMUNITY): Payer: Self-pay

## 2018-08-01 NOTE — Telephone Encounter (Signed)
Left voicemail to review pt's chest xray results.

## 2018-08-01 NOTE — Telephone Encounter (Signed)
-----   Message from Jolaine Artist, MD sent at 07/30/2018 10:53 PM EST ----- Pleural effusion much improved with diuresis.

## 2018-08-04 ENCOUNTER — Telehealth (HOSPITAL_COMMUNITY): Payer: Self-pay

## 2018-08-04 NOTE — Telephone Encounter (Signed)
-----   Message from Jolaine Artist, MD sent at 07/30/2018 10:53 PM EST ----- Pleural effusion much improved with diuresis.

## 2018-08-04 NOTE — Telephone Encounter (Signed)
Pt called to receive cxr results. Pt aware, agreeable and verbalized understanding.

## 2018-08-12 ENCOUNTER — Telehealth (HOSPITAL_COMMUNITY): Payer: Self-pay | Admitting: Emergency Medicine

## 2018-08-12 ENCOUNTER — Ambulatory Visit (HOSPITAL_COMMUNITY)
Admission: RE | Admit: 2018-08-12 | Discharge: 2018-08-12 | Disposition: A | Discharge status: Home / Self Care | Payer: PPO | Source: Ambulatory Visit | Attending: Internal Medicine | Admitting: Internal Medicine

## 2018-08-12 DIAGNOSIS — I509 Heart failure, unspecified: Secondary | ICD-10-CM | POA: Diagnosis not present

## 2018-08-12 LAB — PULMONARY FUNCTION TEST
DL/VA % PRED: 65 %
DL/VA: 2.63 ml/min/mmHg/L
DLCO UNC % PRED: 57 %
DLCO UNC: 14.25 ml/min/mmHg
FEF 25-75 PRE: 1.24 L/s
FEF 25-75 Post: 1.73 L/sec
FEF2575-%Change-Post: 39 %
FEF2575-%PRED-POST: 75 %
FEF2575-%Pred-Pre: 54 %
FEV1-%Change-Post: 7 %
FEV1-%PRED-PRE: 75 %
FEV1-%Pred-Post: 80 %
FEV1-Post: 2.46 L
FEV1-Pre: 2.3 L
FEV1FVC-%Change-Post: 5 %
FEV1FVC-%Pred-Pre: 94 %
FEV6-%CHANGE-POST: 3 %
FEV6-%Pred-Post: 85 %
FEV6-%Pred-Pre: 81 %
FEV6-POST: 3.35 L
FEV6-PRE: 3.22 L
FEV6FVC-%CHANGE-POST: 1 %
FEV6FVC-%PRED-POST: 104 %
FEV6FVC-%PRED-PRE: 102 %
FVC-%Change-Post: 1 %
FVC-%PRED-PRE: 79 %
FVC-%Pred-Post: 81 %
FVC-PRE: 3.33 L
FVC-Post: 3.4 L
POST FEV6/FVC RATIO: 99 %
PRE FEV1/FVC RATIO: 69 %
Post FEV1/FVC ratio: 72 %
Pre FEV6/FVC Ratio: 97 %
RV % pred: 115 %
RV: 2.82 L
TLC % PRED: 86 %
TLC: 5.88 L

## 2018-08-12 MED ORDER — ALBUTEROL SULFATE (2.5 MG/3ML) 0.083% IN NEBU
2.5000 mg | INHALATION_SOLUTION | Freq: Once | RESPIRATORY_TRACT | Status: AC
  Administered 2018-08-12: 2.5 mg via RESPIRATORY_TRACT

## 2018-08-12 NOTE — Telephone Encounter (Signed)
Left message on voicemail with name and callback number Gatlyn Lipari RN Navigator Cardiac Imaging [facility] [phone] Office [phone] Cell  

## 2018-08-12 NOTE — Telephone Encounter (Signed)
Pt returning phone call --  pt verbalizes understanding of appt date/time, parking situation and where to check in, and verified current allergies; name and call back number provided for further questions should they arise Marchia Bond RN Navigator Cardiac Imaging [facility] [phone] office [phone] cell  Pt voices concerns for claustrophobia, will relay this information to the ordering provider.

## 2018-08-13 ENCOUNTER — Telehealth (HOSPITAL_COMMUNITY): Payer: Self-pay

## 2018-08-13 MED ORDER — DIAZEPAM 2 MG PO TABS: 2.0000 mg | ORAL_TABLET | Freq: Once | ORAL | 0 refills | Status: AC

## 2018-08-13 NOTE — Telephone Encounter (Signed)
-----   Message from Jolaine Artist, MD sent at 08/13/2018  8:43 AM EST ----- Regarding: RE: cMR 2/20  Sure  Hannahmarie Asberry,  Can we give him valium 2.5 mg of valium prior to MRI? Thanks. -dan ----- Message ----- From: Lorenza Evangelist, RN Sent: 08/12/2018   4:37 PM EST To: Jolaine Artist, MD Subject: cMR 2/20                                       Dr. Haroldine Laws,   I just called this patient to prep him for his upcoming cardiac MR on 08/14/18 at 15:00. Pt is requesting a small dose of anti-anxiety medication as he is claustrophobic. Can you call in something for him?   Marchia Bond RN Navigator Cardiac Imaging [facility] [phone] Office  [phone] Cell

## 2018-08-13 NOTE — Telephone Encounter (Signed)
Prescription for Valium 2mg  (1 tab) once before MRI printed and faxed to pharmacy. [phone] LM on patient VM of same

## 2018-08-14 ENCOUNTER — Ambulatory Visit (HOSPITAL_COMMUNITY)
Admission: RE | Admit: 2018-08-14 | Discharge: 2018-08-14 | Disposition: A | Discharge status: Home / Self Care | Payer: PPO | Source: Ambulatory Visit | Attending: Internal Medicine | Admitting: Internal Medicine

## 2018-08-14 DIAGNOSIS — I509 Heart failure, unspecified: Secondary | ICD-10-CM | POA: Diagnosis not present

## 2018-08-14 MED ORDER — GADOBUTROL 1 MMOL/ML IV SOLN
10.0000 mL | Freq: Once | INTRAVENOUS | Status: AC | PRN
  Administered 2018-08-14: 10 mL via INTRAVENOUS

## 2018-08-19 ENCOUNTER — Telehealth (HOSPITAL_COMMUNITY): Payer: Self-pay

## 2018-08-19 NOTE — Telephone Encounter (Signed)
Pt aware of results of MRI.  Advised of recommendations for biopsy.  Pt would like to discuss further with Dr. Haroldine Laws.  D/w Dr. Haroldine Laws, he will call patient to discuss further.

## 2018-08-19 NOTE — Telephone Encounter (Signed)
-----   Message from Jolaine Artist, MD sent at 08/15/2018  1:40 PM EST ----- Markedly abnormal. We will need to arrange for cardiac biopsy in [place] cath lab.

## 2018-08-22 ENCOUNTER — Telehealth (HOSPITAL_COMMUNITY): Payer: Self-pay | Admitting: *Deleted

## 2018-08-22 NOTE — Telephone Encounter (Signed)
Pt called stating he talked to Dr Haroldine Laws about his cMRI results and wants to sch his biopsy.    Called [facility] at [phone] and spoke w/RN she advised to fax order and records to them at [phone] and they will contact pt to schedule  Order and records faxed.  Pt is aware and will let us know if he doesn't hear from them by early next week.

## 2018-08-23 ENCOUNTER — Other Ambulatory Visit: Payer: Self-pay | Admitting: Physician Assistant

## 2018-08-27 ENCOUNTER — Ambulatory Visit (HOSPITAL_COMMUNITY)
Admission: RE | Admit: 2018-08-27 | Discharge: 2018-08-27 | Disposition: A | Discharge status: Home / Self Care | Payer: PPO | Source: Ambulatory Visit | Attending: Internal Medicine | Admitting: Internal Medicine

## 2018-08-27 ENCOUNTER — Encounter (HOSPITAL_COMMUNITY): Payer: PPO | Admitting: Internal Medicine

## 2018-08-27 VITALS — BP 100/54 | HR 89 | Wt 191.4 lb

## 2018-08-27 DIAGNOSIS — Z87891 Personal history of nicotine dependence: Secondary | ICD-10-CM | POA: Insufficient documentation

## 2018-08-27 DIAGNOSIS — I5032 Chronic diastolic (congestive) heart failure: Secondary | ICD-10-CM | POA: Diagnosis not present

## 2018-08-27 DIAGNOSIS — E782 Mixed hyperlipidemia: Secondary | ICD-10-CM | POA: Diagnosis not present

## 2018-08-27 DIAGNOSIS — I110 Hypertensive heart disease with heart failure: Secondary | ICD-10-CM | POA: Diagnosis not present

## 2018-08-27 DIAGNOSIS — J90 Pleural effusion, not elsewhere classified: Secondary | ICD-10-CM | POA: Diagnosis not present

## 2018-08-27 DIAGNOSIS — I425 Other restrictive cardiomyopathy: Secondary | ICD-10-CM | POA: Diagnosis not present

## 2018-08-27 DIAGNOSIS — K219 Gastro-esophageal reflux disease without esophagitis: Secondary | ICD-10-CM | POA: Insufficient documentation

## 2018-08-27 DIAGNOSIS — M1990 Unspecified osteoarthritis, unspecified site: Secondary | ICD-10-CM | POA: Insufficient documentation

## 2018-08-27 DIAGNOSIS — G5600 Carpal tunnel syndrome, unspecified upper limb: Secondary | ICD-10-CM | POA: Diagnosis not present

## 2018-08-27 DIAGNOSIS — Z79899 Other long term (current) drug therapy: Secondary | ICD-10-CM | POA: Insufficient documentation

## 2018-08-27 DIAGNOSIS — Z8249 Family history of ischemic heart disease and other diseases of the circulatory system: Secondary | ICD-10-CM | POA: Insufficient documentation

## 2018-08-27 LAB — BASIC METABOLIC PANEL
Anion gap: 10 (ref 5–15)
BUN: 24 mg/dL — ABNORMAL HIGH (ref 8–23)
CO2: 29 mmol/L (ref 22–32)
Calcium: 9.6 mg/dL (ref 8.9–10.3)
Chloride: 100 mmol/L (ref 98–111)
Creatinine, Ser: 1.27 mg/dL — ABNORMAL HIGH (ref 0.61–1.24)
GFR calc Af Amer: >60 mL/min (ref >60)
GFR calc non Af Amer: 56 mL/min — ABNORMAL LOW (ref >60)
Glucose, Bld: 107 mg/dL — ABNORMAL HIGH (ref 70–99)
Potassium: 4.4 mmol/L (ref 3.5–5.1)
Sodium: 139 mmol/L (ref 135–145)

## 2018-08-27 NOTE — Progress Notes (Signed)
Called [facility] to f/u on order for RHC and biopsy that was faxed to them on 2/28.  Spoke w/Esma, RN she states she does have the fax and will create pt a chart and call pt to get him schedule, advised pt would like procedure done next Wed 3/11 if possible, she states that should not be a problem, again states they will call pt to schedule.

## 2018-08-27 NOTE — Patient Instructions (Addendum)
Labs today We will only contact you if something comes back abnormal or we need to make some changes. Otherwise no news is good news!  Your physician requested that you have a PYP scan of your heart.  You were scheduled for this appointment.    You will be called by [facility] to schedule your appointment for RHC and biopsy. They will call you to discuss instructions  Your physician recommends that you schedule a follow-up appointment in: 3 months.

## 2018-08-27 NOTE — Progress Notes (Signed)
Advanced Heart Failure Clinic Note   Referring Physician: Dr. Domenic Polite PCP: Redmond School, MD PCP-Cardiologist: Rozann Lesches, MD   HPI:  Adam Lewis is a 73 y.o. male with a history of hypertension and mixed hyperlipidemia initially referred by Dr. Domenic Polite for evaluation of progressive dyspnea on exertion for about a year, that he noticed while walking the dog.   Echo 8/19 EF 55-60% with moderate LVH restrictive physiology. RVSP 63mHG. He underwent left heart cardiac catheterization in August 2019 which demonstrated normal coronary arteries. LVEDP 27.   Had CT scan in 12/19. Which showed moderate layering pleural effusions and minimal pulmonary edema. Negative for pulmonary embolism. Has history of Carpal tunnel syndrome with operation on R. Has mild tingling on left.   Here for f/u. Recent cMRI shows diffuse LGE suggestive of infiltrative CM. EF 49%. [facility] 5 pounds. Still has cough. No orthopnea or PND. Wife very upset at his diagnosis. Can go to the store but goes slow. Wife struggling with severe anxiety over situation.   Studies:  SPEP/UPEP slight lambda chain presence but no M-spike, serologic w/u negative. ESR = 1  cMRI 08/14/18 1. Septal hypertrophy normal LV size mild diffuse hypokinesis EF 49% 2. Markedly abnormal delayed gadolinium images with severe diffuse myocardial uptake suggesting severe infiltrative cardiomyopathy. Consider biopsy and genetic testing 3.  Mild RV enlargement 4.  Small circumferential pericardial effusion 5.  Mild bi atrial enlargement   Last CXR 07/07/18 Stable interstitial densities are noted concerning for possible pulmonary edema. Increased bibasilar atelectasis is noted with Associated small to moderate L>R pleural effusions.  Review of systems complete and found to be negative unless listed in HPI.    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 Essential hypertension   . GERD (gastroesophageal reflux disease)   . Hypertriglyceridemia   . Osteoarthritis     Current Outpatient Medications  Medication Sig Dispense Refill  . allopurinol (ZYLOPRIM) 100 MG tablet Take 100 mg by mouth daily.   3  . fish oil-omega-3 fatty acids 1000 MG capsule Take 2 g by mouth daily.      . lansoprazole (PREVACID) 30 MG capsule Take 30 mg by mouth daily.      .Marland Kitchenlosartan (COZAAR) 25 MG tablet Take 1 tablet (25 mg total) by mouth daily. 90 tablet 3  . nitroGLYCERIN (NITROSTAT) 0.4 MG SL tablet Place 1 tablet (0.4 mg total) under the tongue every 5 (five) minutes as needed for chest pain. 25 tablet 3  . potassium chloride 20 MEQ TBCR Take 20 mEq by mouth daily. 90 tablet 3  . pravastatin (PRAVACHOL) 80 MG tablet Take 80 mg by mouth daily.   3  . torsemide (DEMADEX) 20 MG tablet Take 2 tablets (40 mg total) by mouth daily. 60 tablet 4   No current facility-administered medications for this encounter.     Allergies  Allergen Reactions  . Bee Venom Hives      Social History   Socioeconomic History  . Marital status: Married    Spouse name: Not on file  . Number of children: Not on file  . Years of education: Not on file  . Highest education level: Not on file  Occupational History  . Not on file  Social Needs  . Financial resource strain: Not on file  . Food insecurity:  Worry: Not on file    Inability: Not on file  . Transportation needs:    Medical: Not on file    Non-medical: Not on file  Tobacco Use  . Smoking status: Former Smoker    Types: Cigarettes    Last attempt to quit: 07/16/1977    Years since quitting: 41.1  . Smokeless tobacco: Never Used  Substance and Sexual Activity  . Alcohol use: Yes    Alcohol/week: 16.0 standard drinks    Types: 1 Glasses of wine, 14 Cans of beer, 1 Shots of liquor per week  . Drug use: No  . Sexual activity: Not on file    Lifestyle  . Physical activity:    Days per week: Not on file    Minutes per session: Not on file  . Stress: Not on file  Relationships  . Social connections:    Talks on phone: Not on file    Gets together: Not on file    Attends religious service: Not on file    Active member of club or organization: Not on file    Attends meetings of clubs or organizations: Not on file    Relationship status: Not on file  . Intimate partner violence:    Fear of current or ex partner: Not on file    Emotionally abused: Not on file    Physically abused: Not on file    Forced sexual activity: Not on file  Other Topics Concern  . Not on file  Social History Narrative  . Not on file    Family History  Problem Relation Age of Onset  . Hypertension Father   . Heart disease Father    Vitals:   08/27/18 1113  BP: (!) 100/54  Pulse: 89  SpO2: 93%  Weight: 86.8 kg (191 lb 6.4 oz)     Wt Readings from Last 3 Encounters:  08/27/18 86.8 kg (191 lb 6.4 oz)  07/30/18 86.6 kg (191 lb)  07/22/18 91 kg (200 lb 9.6 oz)    PHYSICAL EXAM: General:  Well appearing. No resp difficulty HEENT: normal Neck: supple. JVP 7. Carotids 2+ bilat; no bruits. No lymphadenopathy or thryomegaly appreciated. Cor: PMI nondisplaced. Regular rate & rhythm. No rubs, gallops or murmurs. Lungs: clear Abdomen: soft, nontender, nondistended. No hepatosplenomegaly. No bruits or masses. Good bowel sounds. Extremities: no cyanosis, clubbing, rash, edema Neuro: alert & orientedx3, cranial nerves grossly intact. moves all 4 extremities w/o difficulty. Affect pleasant   ECG: NSR 72 with iRBBB. LAFB, personally reviewed.   ReDs 34% (51% last visit)  ASSESSMENT & PLAN:  1.Chronic diastolic HF from restrictive CM - cMRI 2/20 shows diffuse LGE suggestive of infiltrative CM. 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 Have scheduled for RHC and EM biopsy at [facility].  - Remains NYHA II-III - Volume status  better. ReDS 34% (down from 51%) - Continue torsemide 40 mg daily. Can cut back to 26m daily as needed - Continue KCl 20 mg daily. Labs today  - ESR, ANA, ANCA, RF, [place]-70, myeloma panel unremarkable. - PFTs 2/18 relatively normal with mildly decreased DLCO  2. Pleural effusion - much improved after diuresis.  DGlori Bickers MD  11:33 AM

## 2018-08-27 NOTE — Addendum Note (Signed)
Encounter addended by: Scarlette Calico, RN on: 08/27/2018 12:06 PM  Actions taken: Clinical Note Signed

## 2018-08-27 NOTE — Addendum Note (Signed)
Encounter addended by: Valeda Malm, RN on: 08/27/2018 12:08 PM  Actions taken: Charge Capture section accepted, Order list changed, Diagnosis association updated, Clinical Note Signed

## 2018-08-27 NOTE — Addendum Note (Signed)
Encounter addended by: Valeda Malm, RN on: 08/27/2018 12:19 PM  Actions taken: Clinical Note Signed

## 2018-08-29 ENCOUNTER — Ambulatory Visit (HOSPITAL_COMMUNITY)
Admission: RE | Admit: 2018-08-29 | Discharge: 2018-08-29 | Disposition: A | Discharge status: Home / Self Care | Payer: PPO | Source: Ambulatory Visit | Attending: Cardiology | Admitting: Cardiology

## 2018-08-29 ENCOUNTER — Telehealth (HOSPITAL_COMMUNITY): Payer: Self-pay

## 2018-08-29 DIAGNOSIS — I5032 Chronic diastolic (congestive) heart failure: Secondary | ICD-10-CM | POA: Diagnosis not present

## 2018-08-29 LAB — CBC
HCT: 42.1 % (ref 39.0–52.0)
Hemoglobin: 13.9 g/dL (ref 13.0–17.0)
MCH: 31 pg (ref 26.0–34.0)
MCHC: 33 g/dL (ref 30.0–36.0)
MCV: 94 fL (ref 80.0–100.0)
Platelets: 261 10*3/uL (ref 150–400)
RBC: 4.48 MIL/uL (ref 4.22–5.81)
RDW: 11.9 % (ref 11.5–15.5)
WBC: 8 10*3/uL (ref 4.0–10.5)
nRBC: 0 % (ref 0.0–0.2)

## 2018-08-29 NOTE — Telephone Encounter (Signed)
New prescription for RHC and cardiac biopsy, recent lab work and office notes faxed to [facility] at [phone]

## 2018-08-29 NOTE — Telephone Encounter (Signed)
Spoke to pt about additional lab work for procedure on Tuesday. Pt able to come in today at 2.:45. placed order for lab work.

## 2018-09-02 ENCOUNTER — Encounter (HOSPITAL_COMMUNITY)
Admission: RE | Admit: 2018-09-02 | Discharge: 2018-09-02 | Disposition: A | Discharge status: Home / Self Care | Payer: PPO | Source: Ambulatory Visit | Attending: Internal Medicine | Admitting: Internal Medicine

## 2018-09-02 DIAGNOSIS — I5032 Chronic diastolic (congestive) heart failure: Secondary | ICD-10-CM | POA: Insufficient documentation

## 2018-09-02 DIAGNOSIS — I509 Heart failure, unspecified: Secondary | ICD-10-CM | POA: Diagnosis not present

## 2018-09-02 MED ORDER — TECHNETIUM TC 99M PYROPHOSPHATE
20.7000 | Freq: Once | INTRAVENOUS | Status: AC | PRN
  Administered 2018-09-02: 20.7 via INTRAVENOUS

## 2018-09-04 DIAGNOSIS — I10 Essential (primary) hypertension: Secondary | ICD-10-CM | POA: Diagnosis not present

## 2018-09-04 DIAGNOSIS — I517 Cardiomegaly: Secondary | ICD-10-CM | POA: Diagnosis not present

## 2018-09-04 DIAGNOSIS — I5032 Chronic diastolic (congestive) heart failure: Secondary | ICD-10-CM | POA: Diagnosis not present

## 2018-09-04 DIAGNOSIS — E854 Organ-limited amyloidosis: Secondary | ICD-10-CM | POA: Diagnosis not present

## 2018-09-04 DIAGNOSIS — I110 Hypertensive heart disease with heart failure: Secondary | ICD-10-CM | POA: Diagnosis not present

## 2018-09-04 DIAGNOSIS — I272 Pulmonary hypertension, unspecified: Secondary | ICD-10-CM | POA: Diagnosis not present

## 2018-09-04 DIAGNOSIS — R0602 Shortness of breath: Secondary | ICD-10-CM | POA: Diagnosis not present

## 2018-09-04 DIAGNOSIS — K219 Gastro-esophageal reflux disease without esophagitis: Secondary | ICD-10-CM | POA: Diagnosis not present

## 2018-09-04 DIAGNOSIS — Z79899 Other long term (current) drug therapy: Secondary | ICD-10-CM | POA: Diagnosis not present

## 2018-09-04 DIAGNOSIS — E781 Pure hyperglyceridemia: Secondary | ICD-10-CM | POA: Diagnosis not present

## 2018-09-04 DIAGNOSIS — R9431 Abnormal electrocardiogram [ECG] [EKG]: Secondary | ICD-10-CM | POA: Diagnosis not present

## 2018-09-04 DIAGNOSIS — Z4821 Encounter for aftercare following heart transplant: Secondary | ICD-10-CM | POA: Diagnosis not present

## 2018-09-04 DIAGNOSIS — I444 Left anterior fascicular block: Secondary | ICD-10-CM | POA: Diagnosis not present

## 2018-09-04 DIAGNOSIS — I429 Cardiomyopathy, unspecified: Secondary | ICD-10-CM | POA: Diagnosis not present

## 2018-09-04 DIAGNOSIS — I425 Other restrictive cardiomyopathy: Secondary | ICD-10-CM | POA: Diagnosis not present

## 2018-09-10 ENCOUNTER — Other Ambulatory Visit (HOSPITAL_COMMUNITY): Payer: Self-pay | Admitting: Internal Medicine

## 2018-09-11 DIAGNOSIS — I515 Myocardial degeneration: Secondary | ICD-10-CM | POA: Diagnosis not present

## 2018-09-11 DIAGNOSIS — I423 Endomyocardial (eosinophilic) disease: Secondary | ICD-10-CM | POA: Diagnosis not present

## 2018-09-11 DIAGNOSIS — E854 Organ-limited amyloidosis: Secondary | ICD-10-CM | POA: Diagnosis not present

## 2018-09-11 DIAGNOSIS — I43 Cardiomyopathy in diseases classified elsewhere: Secondary | ICD-10-CM | POA: Diagnosis not present

## 2018-09-23 ENCOUNTER — Telehealth (HOSPITAL_COMMUNITY): Payer: Self-pay | Admitting: Cardiology

## 2018-09-23 DIAGNOSIS — I5032 Chronic diastolic (congestive) heart failure: Secondary | ICD-10-CM

## 2018-09-23 DIAGNOSIS — I425 Other restrictive cardiomyopathy: Secondary | ICD-10-CM

## 2018-09-23 NOTE — Telephone Encounter (Signed)
Looks suspicious for amyloid but still discussing with the pathologists. I will catch up with him soon

## 2018-09-23 NOTE — Telephone Encounter (Signed)
Patient called to request cardiac biopsy results done at [facility].

## 2018-09-24 NOTE — Addendum Note (Signed)
Addended by: Valeda Malm on: 09/24/2018 03:01 PM   Modules accepted: Orders

## 2018-09-24 NOTE — Telephone Encounter (Signed)
Spoke with patient, made him aware that Dr. Haroldine Laws is still in communication with pathologist regarding biopsy results and he would get a call once report is final.  Pt verbalized appreciation.

## 2018-09-24 NOTE — Telephone Encounter (Signed)
Per Dr Haroldine Laws, pt to have labs/urine drawn for kappa lambda serum free light chains and urine IFE.Marland Kitchen  Appt made for tomorrow 945a. Pt appreciative

## 2018-09-25 ENCOUNTER — Ambulatory Visit (HOSPITAL_COMMUNITY)
Admission: RE | Admit: 2018-09-25 | Discharge: 2018-09-25 | Disposition: A | Discharge status: Home / Self Care | Payer: PPO | Source: Ambulatory Visit | Attending: Cardiology | Admitting: Cardiology

## 2018-09-25 ENCOUNTER — Other Ambulatory Visit: Payer: Self-pay

## 2018-09-25 DIAGNOSIS — R5383 Other fatigue: Secondary | ICD-10-CM | POA: Diagnosis not present

## 2018-09-25 DIAGNOSIS — I5032 Chronic diastolic (congestive) heart failure: Secondary | ICD-10-CM | POA: Insufficient documentation

## 2018-09-25 DIAGNOSIS — I425 Other restrictive cardiomyopathy: Secondary | ICD-10-CM

## 2018-09-25 DIAGNOSIS — I10 Essential (primary) hypertension: Secondary | ICD-10-CM | POA: Diagnosis not present

## 2018-09-25 DIAGNOSIS — D649 Anemia, unspecified: Secondary | ICD-10-CM | POA: Diagnosis not present

## 2018-09-26 LAB — IFE AND PE, RANDOM URINE
% BETA, Urine: 4.7 %
ALPHA 1 URINE: 0.4 %
Albumin, U: 9.9 %
Alpha 2, Urine: 4.3 %
GAMMA GLOBULIN URINE: 80.6 %
M-SPIKE %, Urine: 78.5 % — ABNORMAL HIGH
Total Protein, Urine: 289.6 mg/dL

## 2018-09-26 LAB — KAPPA/LAMBDA LIGHT CHAINS
Kappa free light chain: 14.2 mg/L (ref 3.3–19.4)
Kappa, lambda light chain ratio: 0.01 — ABNORMAL LOW (ref 0.26–1.65)
Lambda free light chains: 2293.1 mg/L — ABNORMAL HIGH (ref 5.7–26.3)

## 2018-09-29 DIAGNOSIS — R338 Other retention of urine: Secondary | ICD-10-CM | POA: Diagnosis not present

## 2018-09-29 DIAGNOSIS — E7849 Other hyperlipidemia: Secondary | ICD-10-CM | POA: Diagnosis not present

## 2018-09-29 DIAGNOSIS — I10 Essential (primary) hypertension: Secondary | ICD-10-CM | POA: Diagnosis not present

## 2018-09-29 DIAGNOSIS — Z0001 Encounter for general adult medical examination with abnormal findings: Secondary | ICD-10-CM | POA: Diagnosis not present

## 2018-09-29 DIAGNOSIS — M21612 Bunion of left foot: Secondary | ICD-10-CM | POA: Diagnosis not present

## 2018-09-29 DIAGNOSIS — R339 Retention of urine, unspecified: Secondary | ICD-10-CM | POA: Diagnosis not present

## 2018-09-29 DIAGNOSIS — Z681 Body mass index (BMI) 19 or less, adult: Secondary | ICD-10-CM | POA: Diagnosis not present

## 2018-09-29 DIAGNOSIS — Z1389 Encounter for screening for other disorder: Secondary | ICD-10-CM | POA: Diagnosis not present

## 2018-09-30 ENCOUNTER — Other Ambulatory Visit: Payer: Self-pay

## 2018-09-30 ENCOUNTER — Ambulatory Visit (HOSPITAL_COMMUNITY)
Admission: RE | Admit: 2018-09-30 | Discharge: 2018-09-30 | Disposition: A | Discharge status: Home / Self Care | Payer: PPO | Source: Ambulatory Visit | Attending: Internal Medicine | Admitting: Internal Medicine

## 2018-09-30 DIAGNOSIS — I43 Cardiomyopathy in diseases classified elsewhere: Secondary | ICD-10-CM | POA: Diagnosis not present

## 2018-09-30 DIAGNOSIS — R9439 Abnormal result of other cardiovascular function study: Secondary | ICD-10-CM | POA: Diagnosis not present

## 2018-09-30 DIAGNOSIS — I5032 Chronic diastolic (congestive) heart failure: Secondary | ICD-10-CM | POA: Diagnosis not present

## 2018-09-30 DIAGNOSIS — E854 Organ-limited amyloidosis: Secondary | ICD-10-CM | POA: Diagnosis not present

## 2018-09-30 NOTE — Progress Notes (Signed)
Per Dr Haroldine Laws, pt needs bmet/cbc in 2 weeks and a bone marrow biopsy once scheduling restrictions have lifted.  Spoke w/pt and he is aware and agreeable, labs sch for 4/23.

## 2018-10-01 NOTE — Progress Notes (Signed)
Heart Failure TeleHealth Note  Due to national recommendations of social distancing due to [place] 19, Audio/video telehealth visit is felt to be most appropriate for this patient at this time.  See MyChart message from today for patient consent regarding telehealth for [name].  Date:  10/01/2018   ID:  [name], DOB 20-Oct-1945, MRN [id number]  Location: Home  Provider location: [facility] Type of Visit: Established patient  PCP:  Redmond School, MD  Cardiologist:  Rozann Lesches, MD Primary HF: Bensimhon  Chief Complaint: Cardiac amyloidosis   History of Present Illness:  Adam Lewis is a 73 y.o. male with a history of hypertension and mixed hyperlipidemiainitially referred by Dr. 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 He presents via Engineer, civil (consulting) for a telehealth visit today.     Studies  - Echo 8/19 EF 55-60% with moderate LVH restrictive physiology. RVSP 50mHG. - Cardiac catheterization 8/19 demonstrated normal coronary arteries.LVEDP 27.  - Chest CT scan 12/19: Moderate layering pleural effusions and minimal pulmonary edema.Negative for pulmonary embolism or sarcoid. - cMRI 08/14/18 1. Septal hypertrophy normal LV size mild diffuse hypokinesis EF 49% 2. Markedly abnormal delayed gadolinium images with severe diffuse myocardial uptake suggesting severe infiltrative cardiomyopathy. Consider biopsy and genetic testing 3. Mild RV enlargement 4. Small circumferential pericardial effusion 5. Mild bi-atrial enlargement - PYP 3/20: Quantitative H/CL 1.58 but visually equivocal  Since we last saw him [facility] but not lare enough sample for mass spectrometry. Had UPEP and Urine IFE with m-spike and Lambda light chains.   Says he feels prety good. Volume status much improved. Minimal edema. No orthopnea or PND. Able to do ADLs without problem.  BP fluctuates running from high 80s (rare to 100-115). Denis significant orthostatic symptoms.    He denies symptoms worrisome for COVID 19.   Past Medical History:  Diagnosis Date  . Essential hypertension   . GERD (gastroesophageal reflux disease)   . Hypertriglyceridemia   . Osteoarthritis    Past Surgical History:  Procedure Laterality Date  . CARPAL TUNNEL RELEASE    . COLONOSCOPY  01/02/2011   Procedure: COLONOSCOPY;  Surgeon: MJamesetta So  [facility];  Service: Gastroenterology;  Laterality: N/A;  . LEFT HEART CATH AND CORONARY ANGIOGRAPHY N/A 02/18/2018   Procedure: LEFT HEART CATH AND CORONARY ANGIOGRAPHY;  Surgeon: KTroy Sine MD;  [place];  Service: Cardiovascular;  Laterality: N/A;  . LITHOTRIPSY  05/2009  . 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 allopurinol (ZYLOPRIM) 100 MG tablet Take 100 mg by mouth daily.   3  . fish oil-omega-3 fatty acids 1000 MG capsule Take 2 g by mouth daily.      . lansoprazole (PREVACID) 30 MG capsule Take 30 mg by mouth daily.      .Marland Kitchenlosartan (COZAAR) 25 MG tablet Take 1 tablet (25 mg total) by mouth daily. 90 tablet 3  . nitroGLYCERIN (NITROSTAT) 0.4 MG SL tablet Place 1 tablet (0.4 mg total) under the tongue every 5 (five) minutes as needed for chest pain. 25 tablet 3  . potassium chloride 20 MEQ TBCR Take 20 mEq by mouth daily. 90 tablet 3  . pravastatin (PRAVACHOL) 80 MG tablet Take 80 mg by mouth daily.   3  . torsemide (DEMADEX) 20 MG tablet Take 2  tablets (40 mg total) by mouth daily. 60 tablet 4   No current facility-administered medications for this encounter.     Allergies:   Bee venom   Social History:  The patient  reports that he quit smoking about 41 years ago. His smoking use included cigarettes. He has never used smokeless tobacco. He reports current alcohol use of about 16.0 standard drinks of alcohol  per week. He reports that he does not use drugs.   Family History:  The patient's family history includes Heart disease in his father; Hypertension in his father.   ROS:  Please see the history of present illness.   All other systems are personally reviewed and negative.   Exam:  (Video/Tele Health Call; Exam is subjective and or/visual.) General:  Speaks in full sentences. No resp difficulty. Lungs: Normal respiratory effort with conversation.  Abdomen: Non-distended per patient report Extremities: Pt denies edema. Neuro: Alert & oriented x 3.   Recent Labs: 07/07/2018: TSH 3.653 07/22/2018: ALT 25 07/30/2018: B Natriuretic Peptide 928.8 08/27/2018: BUN 24; Creatinine, Ser 1.27; Potassium 4.4; Sodium 139 08/29/2018: Hemoglobin 13.9; Platelets 261  Personally reviewed   Wt Readings from Last 3 Encounters:  08/27/18 86.8 kg (191 lb 6.4 oz)  07/30/18 86.6 kg (191 lb)  07/22/18 91 kg (200 lb 9.6 oz)      ASSESSMENT AND PLAN:  1.Chronic diastolic HF from restrictive CM - cMRI 2/20 shows diffuse LGE suggestive of diffuse  infiltrative CM. 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 Will arrange for bone marrow biopsy with IR (as COVID situation permits) to further evaluate and decide next steps  - Remains NYHA II-III - Volume status improved - Continue torsemide 40 mg daily. Can cut back to 76m daily as needed - Continue KCl 20 mg daily.   COVID screen The patient does not have any symptoms that suggest any further testing/ screening at this time.  Social distancing reinforced today.  Recommended follow-up:  As above  Relevant cardiac medications were reviewed at length with the patient today.    The patient does not have concerns regarding their medications at this time.   The following changes were made today:  As above  Today, I have spent 22 minutes with the patient with telehealth technology discussing the above issues .    Signed, DGlori Bickers MD  10/01/2018 6:07 PM  [id number] [phone]) ([phone](fax)

## 2018-10-02 NOTE — Progress Notes (Signed)
Note faxed to Dr Gerarda Fraction

## 2018-10-02 NOTE — Addendum Note (Signed)
Encounter addended by: Scarlette Calico, RN on: 10/02/2018 8:54 AM  Actions taken: Clinical Note Signed

## 2018-10-14 ENCOUNTER — Other Ambulatory Visit (HOSPITAL_COMMUNITY): Payer: Self-pay | Admitting: *Deleted

## 2018-10-14 MED ORDER — TORSEMIDE 20 MG PO TABS: 40.0000 mg | ORAL_TABLET | Freq: Every day | ORAL | 4 refills | Status: DC

## 2018-10-16 ENCOUNTER — Ambulatory Visit (HOSPITAL_COMMUNITY)
Admission: RE | Admit: 2018-10-16 | Discharge: 2018-10-16 | Disposition: A | Discharge status: Home / Self Care | Payer: PPO | Source: Ambulatory Visit | Attending: Cardiology | Admitting: Cardiology

## 2018-10-16 ENCOUNTER — Other Ambulatory Visit: Payer: Self-pay

## 2018-10-16 DIAGNOSIS — R0602 Shortness of breath: Secondary | ICD-10-CM | POA: Insufficient documentation

## 2018-10-16 DIAGNOSIS — E854 Organ-limited amyloidosis: Secondary | ICD-10-CM | POA: Insufficient documentation

## 2018-10-16 DIAGNOSIS — I5032 Chronic diastolic (congestive) heart failure: Secondary | ICD-10-CM | POA: Diagnosis not present

## 2018-10-16 DIAGNOSIS — I43 Cardiomyopathy in diseases classified elsewhere: Secondary | ICD-10-CM | POA: Insufficient documentation

## 2018-10-16 DIAGNOSIS — R9439 Abnormal result of other cardiovascular function study: Secondary | ICD-10-CM | POA: Insufficient documentation

## 2018-10-16 LAB — BASIC METABOLIC PANEL WITH GFR
Anion gap: 13 (ref 5–15)
BUN: 27 mg/dL — ABNORMAL HIGH (ref 8–23)
CO2: 27 mmol/L (ref 22–32)
Calcium: 9.4 mg/dL (ref 8.9–10.3)
Chloride: 101 mmol/L (ref 98–111)
Creatinine, Ser: 1.52 mg/dL — ABNORMAL HIGH (ref 0.61–1.24)
GFR calc Af Amer: 52 mL/min — ABNORMAL LOW
GFR calc non Af Amer: 45 mL/min — ABNORMAL LOW
Glucose, Bld: 106 mg/dL — ABNORMAL HIGH (ref 70–99)
Potassium: 4.2 mmol/L (ref 3.5–5.1)
Sodium: 141 mmol/L (ref 135–145)

## 2018-10-16 LAB — CBC
HCT: 41.1 % (ref 39.0–52.0)
Hemoglobin: 13.6 g/dL (ref 13.0–17.0)
MCH: 32.4 pg (ref 26.0–34.0)
MCHC: 33.1 g/dL (ref 30.0–36.0)
MCV: 97.9 fL (ref 80.0–100.0)
Platelets: 235 10*3/uL (ref 150–400)
RBC: 4.2 MIL/uL — ABNORMAL LOW (ref 4.22–5.81)
RDW: 12.9 % (ref 11.5–15.5)
WBC: 8.2 10*3/uL (ref 4.0–10.5)
nRBC: 0 % (ref 0.0–0.2)

## 2018-10-16 NOTE — Addendum Note (Signed)
Encounter addended by: Marlise Eves, RN on: 10/16/2018 3:21 PM  Actions taken: Order list changed, Diagnosis association updated, Visit diagnoses modified

## 2018-10-16 NOTE — Addendum Note (Signed)
Encounter addended by: Marlise Eves, RN on: 10/16/2018 10:36 AM  Actions taken: Clinical Note Signed, Visit Navigator Flowsheet section accepted

## 2018-10-16 NOTE — Addendum Note (Signed)
Encounter addended by: Marlise Eves, RN on: 10/16/2018 10:16 AM  Actions taken: Vitals modified

## 2018-10-16 NOTE — Progress Notes (Signed)
Pt came in lab visit c/o increased shortness of breath. Pt states that he cannot walk >30ft without becoming winded. Pt c/o low home bp's. bp today 110/74, o2 96%. HR 87 weight 190.8lbs. Otc med sheet given to pt along with bp log. Pt wanted to know if he could take any amyloid medication while waiting to have his bone marrow biopsy? 

## 2018-10-17 ENCOUNTER — Other Ambulatory Visit (HOSPITAL_COMMUNITY): Payer: Self-pay | Admitting: *Deleted

## 2018-10-17 ENCOUNTER — Other Ambulatory Visit (HOSPITAL_COMMUNITY): Payer: Self-pay | Admitting: Internal Medicine

## 2018-10-17 ENCOUNTER — Telehealth (HOSPITAL_COMMUNITY): Payer: Self-pay | Admitting: *Deleted

## 2018-10-17 DIAGNOSIS — E854 Organ-limited amyloidosis: Secondary | ICD-10-CM

## 2018-10-17 DIAGNOSIS — I43 Cardiomyopathy in diseases classified elsewhere: Principal | ICD-10-CM

## 2018-10-17 NOTE — Telephone Encounter (Signed)
Per Dr Haroldine Laws, pt needs bone marrow biopsy soon instead of waiting due to increasing symptoms.  Sch for Friday 5/1 11 am at [facility], pt is to arrive at 9:15 am, npo after midnight and must have someone to drive him, that person will not be allowed into facility due to Covid-19 restrictions however the radiologist will call that person to let them know how procedure goes and when to pick pt up at door and they will bring pt out.  Spoke w/pt, he is aware, agreeable and verbalizes understanding.

## 2018-10-22 ENCOUNTER — Other Ambulatory Visit: Payer: Self-pay | Admitting: Radiology

## 2018-10-24 ENCOUNTER — Ambulatory Visit (HOSPITAL_COMMUNITY)
Admission: RE | Admit: 2018-10-24 | Discharge: 2018-10-24 | Disposition: A | Discharge status: Home / Self Care | Payer: PPO | Source: Ambulatory Visit | Attending: Internal Medicine | Admitting: Internal Medicine

## 2018-10-24 ENCOUNTER — Other Ambulatory Visit: Payer: Self-pay

## 2018-10-24 ENCOUNTER — Encounter (HOSPITAL_COMMUNITY): Payer: Self-pay

## 2018-10-24 DIAGNOSIS — C9000 Multiple myeloma not having achieved remission: Secondary | ICD-10-CM | POA: Insufficient documentation

## 2018-10-24 DIAGNOSIS — I43 Cardiomyopathy in diseases classified elsewhere: Secondary | ICD-10-CM | POA: Insufficient documentation

## 2018-10-24 DIAGNOSIS — E854 Organ-limited amyloidosis: Secondary | ICD-10-CM | POA: Insufficient documentation

## 2018-10-24 DIAGNOSIS — I425 Other restrictive cardiomyopathy: Secondary | ICD-10-CM | POA: Diagnosis not present

## 2018-10-24 DIAGNOSIS — I5032 Chronic diastolic (congestive) heart failure: Secondary | ICD-10-CM | POA: Diagnosis not present

## 2018-10-24 HISTORY — DX: Family history of other specified conditions: Z84.89

## 2018-10-24 LAB — CBC WITH DIFFERENTIAL/PLATELET
Abs Immature Granulocytes: 0.01 10*3/uL (ref 0.00–0.07)
Basophils Absolute: 0 10*3/uL (ref 0.0–0.1)
Basophils Relative: 0 %
Eosinophils Absolute: 0.2 10*3/uL (ref 0.0–0.5)
Eosinophils Relative: 2 %
HCT: 39.5 % (ref 39.0–52.0)
Hemoglobin: 13.2 g/dL (ref 13.0–17.0)
Immature Granulocytes: 0 %
Lymphocytes Relative: 19 %
Lymphs Abs: 1.4 10*3/uL (ref 0.7–4.0)
MCH: 32.8 pg (ref 26.0–34.0)
MCHC: 33.4 g/dL (ref 30.0–36.0)
MCV: 98.3 fL (ref 80.0–100.0)
Monocytes Absolute: 0.6 10*3/uL (ref 0.1–1.0)
Monocytes Relative: 8 %
Neutro Abs: 5.2 10*3/uL (ref 1.7–7.7)
Neutrophils Relative %: 71 %
Platelets: 245 10*3/uL (ref 150–400)
RBC: 4.02 MIL/uL — ABNORMAL LOW (ref 4.22–5.81)
RDW: 13.3 % (ref 11.5–15.5)
WBC: 7.5 10*3/uL (ref 4.0–10.5)
nRBC: 0 % (ref 0.0–0.2)

## 2018-10-24 LAB — PROTIME-INR
INR: 1 (ref 0.8–1.2)
Prothrombin Time: 13.4 seconds (ref 11.4–15.2)

## 2018-10-24 MED ORDER — SODIUM CHLORIDE 0.9 % IV SOLN
INTRAVENOUS | Status: AC
  Filled 2018-10-24: qty 250

## 2018-10-24 MED ORDER — MIDAZOLAM HCL 2 MG/2ML IJ SOLN
INTRAMUSCULAR | Status: AC | PRN
  Administered 2018-10-24 (×2): 0.5 mg via INTRAVENOUS

## 2018-10-24 MED ORDER — SODIUM CHLORIDE 0.9 % IV SOLN
INTRAVENOUS | Status: DC
  Administered 2018-10-24: 10:00:00 via INTRAVENOUS

## 2018-10-24 MED ORDER — FLUMAZENIL 0.5 MG/5ML IV SOLN
INTRAVENOUS | Status: AC
  Filled 2018-10-24: qty 5

## 2018-10-24 MED ORDER — MIDAZOLAM HCL 2 MG/2ML IJ SOLN
INTRAMUSCULAR | Status: AC
  Filled 2018-10-24: qty 4

## 2018-10-24 MED ORDER — FENTANYL CITRATE (PF) 100 MCG/2ML IJ SOLN
INTRAMUSCULAR | Status: AC
  Filled 2018-10-24: qty 4

## 2018-10-24 MED ORDER — LIDOCAINE HCL (PF) 1 % IJ SOLN
INTRAMUSCULAR | Status: AC | PRN
  Administered 2018-10-24: 10 mL

## 2018-10-24 MED ORDER — NALOXONE HCL 0.4 MG/ML IJ SOLN
INTRAMUSCULAR | Status: AC
  Filled 2018-10-24: qty 1

## 2018-10-24 NOTE — Procedures (Addendum)
Interventional Radiology Procedure Note  Procedure: CT guided aspirate and core biopsy of LEFT iliac bone Complications: None Recommendations: - Bedrest supine x 1 hrs - Hydrocodone PRN  Pain - Follow biopsy results  Signed,  Kamar Callender K. Talyssa Gibas, MD    

## 2018-10-24 NOTE — H&P (Signed)
Chief Complaint: Patient was seen in consultation today for chronic diastolic heart failure  Referring Physician(s): Bensimhon,Daniel R  Supervising Physician: Jacqulynn Cadet  Patient Status: [facility] - Out-pt  History of Present Illness: Adam Lewis is a 73 y.o. 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 Request is made for bone marrow biopsy.   Patient presents to [facility] radiology today in his usual state of health. He has cough and shortness of breath at baseline.  Denies concerns or new symptoms.  Denies fever, chills, abdominal pain, nausea, vomiting.  Past Medical History:  Diagnosis Date  . Essential hypertension   . Family history of adverse reaction to anesthesia    father died after OHS in [place], unaware of reason  . GERD (gastroesophageal reflux disease)   . Hypertriglyceridemia   . Osteoarthritis     Past Surgical History:  Procedure Laterality Date  . CARPAL TUNNEL RELEASE    . COLONOSCOPY  01/02/2011   Procedure: COLONOSCOPY;  Surgeon: Jamesetta So;  [facility];  Service: Gastroenterology;  Laterality: N/A;  . LEFT HEART CATH AND CORONARY ANGIOGRAPHY N/A 02/18/2018   Procedure: LEFT HEART CATH AND CORONARY ANGIOGRAPHY;  Surgeon: Troy Sine, MD;  Location: [facility];  Service: Cardiovascular;  Laterality: N/A;  . LITHOTRIPSY  05/2009  . 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    Allergies: Bee venom  Medications: Prior to Admission medications   Medication Sig Start Date End Date Taking? Authorizing Provider  allopurinol (ZYLOPRIM) 100 MG tablet Take 100 mg by mouth daily.  09/30/17   [provider]  fish oil-omega-3 fatty acids 1000 MG capsule Take 2 g by mouth daily.      [provider]  lansoprazole (PREVACID) 30 MG capsule Take 30 mg by mouth daily.      [provider]  losartan (COZAAR) 25 MG tablet Take 1 tablet  (25 mg total) by mouth daily. 07/07/18 10/24/18  Satira Sark, MD  nitroGLYCERIN (NITROSTAT) 0.4 MG SL tablet Place 1 tablet (0.4 mg total) under the tongue every 5 (five) minutes as needed for chest pain. 12/18/17   Strader, Fransisco Hertz, PA-C  potassium chloride 20 MEQ TBCR Take 20 mEq by mouth daily. 07/22/18 10/24/18  Bensimhon, Shaune Pascal, MD  pravastatin (PRAVACHOL) 80 MG tablet Take 80 mg by mouth daily.  09/24/17   [provider]  torsemide (DEMADEX) 20 MG tablet Take 2 tablets (40 mg total) by mouth daily. 10/14/18   Bensimhon, Shaune Pascal, MD     Family History  Problem Relation Age of Onset  . Hypertension Father   . Heart disease Father     Social History   Socioeconomic History  . Marital status: Married    Spouse name: Not on file  . Number of children: Not on file  . Years of education: Not on file  . Highest education level: Not on file  Occupational History  . Not on file  Social Needs  . Financial resource strain: Not on file  . Food insecurity:    Worry: Not on file    Inability: Not on file  . Transportation needs:    Medical: Not on file    Non-medical: Not on file  Tobacco Use  . Smoking status: Former Smoker    Types: Cigarettes    Last attempt to quit: 07/16/1977    Years since quitting: 41.3  . Smokeless tobacco: Never  Used  Substance and Sexual Activity  . Alcohol use: Yes    Alcohol/week: 16.0 standard drinks    Types: 1 Glasses of wine, 14 Cans of beer, 1 Shots of liquor per week  . Drug use: No  . Sexual activity: Not on file  Lifestyle  . Physical activity:    Days per week: Not on file    Minutes per session: Not on file  . Stress: Not on file  Relationships  . Social connections:    Talks on phone: Not on file    Gets together: Not on file    Attends religious service: Not on file    Active member of club or organization: Not on file    Attends meetings of clubs or organizations: Not on file    Relationship status: Not on file   Other Topics Concern  . Not on file  Social History Narrative  . Not on file     Review of Systems: A 12 point ROS discussed and pertinent positives are indicated in the HPI above.  All other systems are negative.  Review of Systems  Constitutional: Negative for fatigue and fever.  Respiratory: Positive for cough (chronic) and shortness of breath (chronic).   Cardiovascular: Negative for chest pain.  Gastrointestinal: Negative for abdominal pain, nausea and vomiting.  Musculoskeletal: Negative for back pain.  Psychiatric/Behavioral: Negative for behavioral problems and confusion.    Vital Signs: There were no vitals taken for this visit.  Physical Exam Vitals signs and nursing note reviewed.  Constitutional:      Appearance: Normal appearance.  HENT:     Mouth/Throat:     Mouth: Mucous membranes are moist.     Pharynx: Oropharynx is clear.  Cardiovascular:     Rate and Rhythm: Normal rate and regular rhythm.  Pulmonary:     Effort: Pulmonary effort is normal. No respiratory distress.     Breath sounds: Normal breath sounds.  Skin:    General: Skin is warm and dry.  Neurological:     General: No focal deficit present.     Mental Status: He is alert and oriented to person, place, and time.  Psychiatric:        Mood and Affect: Mood normal.        Behavior: Behavior normal.        Thought Content: Thought content normal.        Judgment: Judgment normal.      MD Evaluation Airway: WNL Heart: WNL Abdomen: WNL Chest/ Lungs: WNL ASA  Classification: 3 Mallampati/Airway Score: One   Imaging: No results found.  Labs:  CBC: Recent Labs    02/13/18 1110 08/29/18 1515 10/16/18 0957 10/24/18 0942  WBC 8.3 8.0 8.2 7.5  HGB 15.8 13.9 13.6 13.2  HCT 47.1 42.1 41.1 39.5  PLT 228 261 235 245    COAGS: Recent Labs    10/24/18 0942  INR 1.0    BMP: Recent Labs    07/22/18 1212 07/30/18 1117 08/27/18 1153 10/16/18 0957  NA 139 137 139 141  K 4.4  4.2 4.4 4.2  CL 102 99 100 101  CO2 27 27 29 27  GLUCOSE 103* 115* 107* 106*  BUN 15 19 24* 27*  CALCIUM 9.3 9.3 9.6 9.4  CREATININE 1.09 1.21 1.27* 1.52*  GFRNONAA >60 59* 56* 45*  GFRAA >60 >60 >60 52*    LIVER FUNCTION TESTS: Recent Labs    07/22/18 1212  BILITOT 1.2  AST 26  ALT   25  ALKPHOS 50  PROT 6.3*  ALBUMIN 3.6    TUMOR MARKERS: No results for input(s): AFPTM, CEA, CA199, CHROMGRNA in the last 8760 hours.  Assessment and Plan: Patient with past medical history of chronic diastolic heart failure presents for bone marrow biopsy at the request of Dr. Bensihmohn for ongoing work-up of the etiology of his heart failure. Case reviewed by Dr. McCullough who approves patient for procedure.  Patient presents today in their usual state of health.  He has been NPO and is not currently on blood thinners.    Risks and benefits of biopsy was discussed with the patient and/or patient's family including, but not limited to bleeding, infection, damage to adjacent structures or low yield requiring additional tests.  All of the questions were answered and there is agreement to proceed.  Consent signed and in chart.  Thank you for this interesting consult.  I greatly enjoyed meeting Adam Lewis and look forward to participating in their care.  A copy of this report was sent to the requesting provider on this date.  Electronically Signed: Kacie Sue-Ellen Matthews, PA 10/24/2018, 10:59 AM   I spent a total of  30 Minutes   in face to face in clinical consultation, greater than 50% of which was counseling/coordinating care for diastolic heart failure.   

## 2018-10-24 NOTE — Discharge Instructions (Signed)
Moderate Conscious Sedation, Adult, Care After These instructions provide you with information about caring for yourself after your procedure. Your health care provider may also give you more specific instructions. Your treatment has been planned according to current medical practices, but problems sometimes occur. Call your health care provider if you have any problems or questions after your procedure. What can I expect after the procedure? After your procedure, it is common:  To feel sleepy for several hours.  To feel clumsy and have poor balance for several hours.  To have poor judgment for several hours.  To vomit if you eat too soon. Follow these instructions at home: For at least 24 hours after the procedure:   Do not: ? Participate in activities where you could fall or become injured. ? Drive. ? Use heavy machinery. ? Drink alcohol. ? Take sleeping pills or medicines that cause drowsiness. ? Make important decisions or sign legal documents. ? Take care of children on your own.  Rest. Eating and drinking  Follow the diet recommended by your health care provider.  If you vomit: ? Drink water, juice, or soup when you can drink without vomiting. ? Make sure you have little or no nausea before eating solid foods. General instructions  Have a responsible adult stay with you until you are awake and alert.  Take over-the-counter and prescription medicines only as told by your health care provider.  If you smoke, do not smoke without supervision.  Keep all follow-up visits as told by your health care provider. This is important. Contact a health care provider if:  You keep feeling nauseous or you keep vomiting.  You feel light-headed.  You develop a rash.  You have a fever. Get help right away if:  You have trouble breathing. This information is not intended to replace advice given to you by your health care provider. Make sure you discuss any questions you have  with your health care provider. Document Released: 04/01/2013 Document Revised: 11/14/2015 Document Reviewed: 10/01/2015 Elsevier Interactive Patient Education  2019 [employer].   Bone Marrow Aspiration and Bone Marrow Biopsy, Adult, Care After This sheet gives you information about how to care for yourself after your procedure. Your health care provider may also give you more specific instructions. If you have problems or questions, contact your health care provider. What can I expect after the procedure? After the procedure, it is common to have:  Mild pain and tenderness.  Swelling.  Bruising. Follow these instructions at home: Puncture site care   Follow instructions from your health care provider about how to take care of the puncture site. Make sure you: ? Wash your hands with soap and water before you change your bandage (dressing). If soap and water are not available, use hand sanitizer. ? Change your dressing as told by your health care provider. May remove dressing in 24 hours and bathe or shower.  Keep site clean and dry, may replace dressing with bandaid as necessary.  Check your puncture siteevery day for signs of infection. Check for: ? More redness, swelling, or pain. ? More fluid or blood. ? Warmth. ? Pus or a bad smell. General instructions  Take over-the-counter and prescription medicines only as told by your health care provider.  Do not take baths, swim, or use a hot tub until your health care provider approves. Ask if you can take a shower or have a sponge bath.  Return to your normal activities as told by your health care provider.  Ask your health care provider what activities are safe for you.  Do not drive for 24 hours if you were given a medicine to help you relax (sedative) during your procedure.  Keep all follow-up visits as told by your health care provider. This is important. Contact a health care provider if:  Your pain is not controlled with  medicine. Get help right away if:  You have a fever.  You have more redness, swelling, or pain around the puncture site.  You have more fluid or blood coming from the puncture site.  Your puncture site feels warm to the touch.  You have pus or a bad smell coming from the puncture site. These symptoms may represent a serious problem that is an emergency. Do not wait to see if the symptoms will go away. Get medical help right away. Call your local emergency services (911 in the [place] Do not drive yourself to the hospital. Summary  After the procedure, it is common to have mild pain, tenderness, swelling, and bruising.  Follow instructions from your health care provider about how to take care of the puncture site.  Get help right away if you have any symptoms of infection or if you have more blood or fluid coming from the puncture site. This information is not intended to replace advice given to you by your health care provider. Make sure you discuss any questions you have with your health care provider. Document Released: 12/29/2004 Document Revised: 09/24/2017 Document Reviewed: 11/23/2015 Elsevier Interactive Patient Education  2019 [employer].

## 2018-10-27 DIAGNOSIS — I319 Disease of pericardium, unspecified: Secondary | ICD-10-CM | POA: Diagnosis not present

## 2018-10-29 ENCOUNTER — Telehealth (HOSPITAL_COMMUNITY): Payer: Self-pay | Admitting: Cardiology

## 2018-10-29 DIAGNOSIS — I5022 Chronic systolic (congestive) heart failure: Secondary | ICD-10-CM

## 2018-10-29 NOTE — Telephone Encounter (Signed)
Patient called with multiple concerns C/o increased SOB (noticably SOB while speaking on phone), weight increased x 6lbs over the past week (weight today 190, reports it was 184), + BLE. Reports during bone marrow bx his b/p was low and reports SBP is normally 90's/110's Lastly reports torsemide was increased to 40 BID for 2 days at last OV and reports he only saw minimal results,   Advised I would forward to provider for further instructions

## 2018-10-30 NOTE — Telephone Encounter (Signed)
  On 10/24/18 he had bone marrow biopsy and he had some low blood pressure.   SOB with exertion and has a cough that he says has been going on for some time.   Increased leg edema over the last 4 days. Weight has gone up to 183-->189 pounds.    He can walk 20-30 feet then needs a break.   He was instructed to increase torsemide 40 mg twice a day for 2 days then back 20 mg daily.   Refer to Vanita Ingles for Prep program and [employer]. Needs a visit tomorrow. She will need to walk with him and check is oxygen saturation. He may need home oxygen.   We discussed limiting fluid intake.    Adam Lutze NP-C  12:40 PM

## 2018-11-03 ENCOUNTER — Encounter (HOSPITAL_COMMUNITY): Payer: Self-pay | Admitting: Internal Medicine

## 2018-11-04 ENCOUNTER — Telehealth (HOSPITAL_COMMUNITY): Payer: Self-pay | Admitting: Adult Health

## 2018-11-04 NOTE — Addendum Note (Signed)
Addended by: Harvie Junior on: 11/04/2018 09:07 AM   Modules accepted: Orders

## 2018-11-04 NOTE — Telephone Encounter (Signed)
Pt left VM yesterday 11/03/18 at 3:35pm stating he was supposed to have an RN come to his house Friday she did not come or call and still has not heard anything from anyone.Pt said he spoke with someone at our office and was supposed to be contacted by home RN.  Per telephone note from Amy Clegg,NP routed to Chantel Jeffries,CMA on 10/30/2018 at 12:29pm pt was supposed to be referred to Debbie Kinney for prep program & reds clip she will need to walk with him and check oxygen saturation. I do not see where referral was placed. I placed new referral with orders to Debbie Kinney.   Spoke with patient he is aware that Debbie Kinney will be in touch with him to schedule home visit. Also discussed bone marrow biopsy results. Per Dr.Bensimhon bone marrow biopsy negative, patient to start medication for cardiac amyloidosis (RN will be in contact to start medication). Pt verbalized understanding and is agreeable with plan. 

## 2018-11-04 NOTE — Telephone Encounter (Signed)
   Received call from [place] with PREP program regarding Mr Clevenger.   Reds Clip 49% . R and LLE 3+ edema. Weight at home has gone down from 189-->184  pounds.   91-92% room air at rest  Pulse 101  With exertion O2 sats 86-87%.  Pulse 118   He was instructed to increase torsemide to 40 mg twice a day and wear compression socks.   He was instructed to come to HF clinic tomorrow for BMET and BNP. Check Reds Clip tomorrow.    We will also set up home oxygen. He will need 2 liters oxygen with exertion.    Lynea Rollison Np-C  4:18 PM

## 2018-11-05 ENCOUNTER — Ambulatory Visit (HOSPITAL_COMMUNITY)
Admission: RE | Admit: 2018-11-05 | Discharge: 2018-11-05 | Disposition: A | Discharge status: Home / Self Care | Payer: PPO | Source: Ambulatory Visit | Attending: Internal Medicine | Admitting: Internal Medicine

## 2018-11-05 ENCOUNTER — Telehealth (HOSPITAL_COMMUNITY): Payer: Self-pay

## 2018-11-05 ENCOUNTER — Other Ambulatory Visit: Payer: Self-pay

## 2018-11-05 ENCOUNTER — Ambulatory Visit (HOSPITAL_BASED_OUTPATIENT_CLINIC_OR_DEPARTMENT_OTHER)
Admission: RE | Admit: 2018-11-05 | Discharge: 2018-11-05 | Disposition: A | Discharge status: Home / Self Care | Payer: PPO | Source: Ambulatory Visit | Attending: Cardiology | Admitting: Cardiology

## 2018-11-05 ENCOUNTER — Other Ambulatory Visit (HOSPITAL_COMMUNITY): Payer: Self-pay

## 2018-11-05 VITALS — BP 96/50 | HR 92 | Wt 186.0 lb

## 2018-11-05 DIAGNOSIS — M1990 Unspecified osteoarthritis, unspecified site: Secondary | ICD-10-CM | POA: Diagnosis not present

## 2018-11-05 DIAGNOSIS — Z79899 Other long term (current) drug therapy: Secondary | ICD-10-CM | POA: Diagnosis not present

## 2018-11-05 DIAGNOSIS — K219 Gastro-esophageal reflux disease without esophagitis: Secondary | ICD-10-CM | POA: Insufficient documentation

## 2018-11-05 DIAGNOSIS — I110 Hypertensive heart disease with heart failure: Secondary | ICD-10-CM | POA: Insufficient documentation

## 2018-11-05 DIAGNOSIS — R0902 Hypoxemia: Secondary | ICD-10-CM | POA: Diagnosis not present

## 2018-11-05 DIAGNOSIS — Z87891 Personal history of nicotine dependence: Secondary | ICD-10-CM | POA: Insufficient documentation

## 2018-11-05 DIAGNOSIS — E854 Organ-limited amyloidosis: Secondary | ICD-10-CM

## 2018-11-05 DIAGNOSIS — I425 Other restrictive cardiomyopathy: Secondary | ICD-10-CM | POA: Insufficient documentation

## 2018-11-05 DIAGNOSIS — I5022 Chronic systolic (congestive) heart failure: Secondary | ICD-10-CM

## 2018-11-05 DIAGNOSIS — I43 Cardiomyopathy in diseases classified elsewhere: Secondary | ICD-10-CM | POA: Diagnosis not present

## 2018-11-05 DIAGNOSIS — Z8249 Family history of ischemic heart disease and other diseases of the circulatory system: Secondary | ICD-10-CM | POA: Diagnosis not present

## 2018-11-05 DIAGNOSIS — I5032 Chronic diastolic (congestive) heart failure: Secondary | ICD-10-CM

## 2018-11-05 DIAGNOSIS — E781 Pure hyperglyceridemia: Secondary | ICD-10-CM | POA: Insufficient documentation

## 2018-11-05 LAB — BASIC METABOLIC PANEL
Anion gap: 11 (ref 5–15)
BUN: 23 mg/dL (ref 8–23)
CO2: 28 mmol/L (ref 22–32)
Calcium: 9 mg/dL (ref 8.9–10.3)
Chloride: 97 mmol/L — ABNORMAL LOW (ref 98–111)
Creatinine, Ser: 1.45 mg/dL — ABNORMAL HIGH (ref 0.61–1.24)
GFR calc Af Amer: 55 mL/min — ABNORMAL LOW (ref >60)
GFR calc non Af Amer: 48 mL/min — ABNORMAL LOW (ref >60)
Glucose, Bld: 152 mg/dL — ABNORMAL HIGH (ref 70–99)
Potassium: 3.6 mmol/L (ref 3.5–5.1)
Sodium: 136 mmol/L (ref 135–145)

## 2018-11-05 LAB — BRAIN NATRIURETIC PEPTIDE: B Natriuretic Peptide: 1481 pg/mL — ABNORMAL HIGH (ref 0.0–100.0)

## 2018-11-05 MED ORDER — POTASSIUM CHLORIDE CRYS ER 10 MEQ PO TBCR: 20.0000 meq | EXTENDED_RELEASE_TABLET | Freq: Every day | ORAL | Status: DC

## 2018-11-05 MED ORDER — METOLAZONE 2.5 MG PO TABS: 2.5000 mg | ORAL_TABLET | ORAL | 3 refills | Status: DC

## 2018-11-05 MED ORDER — TORSEMIDE 20 MG PO TABS: 60.0000 mg | ORAL_TABLET | Freq: Every day | ORAL | 4 refills | Status: DC

## 2018-11-05 NOTE — Patient Instructions (Addendum)
Take Metolazone 2.5 mg TODAY AND TOMORROW ONLY  Increase Torsemide to 60 mg (3 tabs) daily  When you take metolazone make sure to take an extra 20 meq (2 tabs) of Potassium  We have ordered you home oxygen, this order was sent to [facility], they will contact you to schedule a delivery time.  Your physician recommends that you schedule a follow-up appointment in: 1 week  Genetic test has been done, this has to be sent to [place] to be processed and can take 1-2 weeks to get results back.  We will let you know the results.

## 2018-11-05 NOTE — Addendum Note (Signed)
Encounter addended by: Scarlette Calico, RN on: 11/05/2018 11:50 AM  Actions taken: Clinical Note Signed

## 2018-11-05 NOTE — Addendum Note (Signed)
Encounter addended by: Scarlette Calico, RN on: 11/05/2018 2:33 PM  Actions taken: Order list changed, Diagnosis association updated, Clinical Note Signed

## 2018-11-05 NOTE — Progress Notes (Signed)
Adam Lewis        DOB: 19-Aug-1945  Purpose of Visit: Home Visit: VS, ReDS, Walking O2 sat HF provider: Bensimhon  Medications: Is the patient taking all medications listed on MAR from Epic? yes List any medications that are not being taken correctly:  Per Dr. Linna Hoff, pt took Torsemide 40mg  BID x 2 days last week, Thurs & Fri.  On Sat & Sun he decided to take 40 mg in AM and 20mg  in PM (since he still had swelling and shob feeling it wouldn't hurt).  Yesterday (Monday 5/11) and Today (5/12) he resumed his 40mg  Qam regime.    List any medication refills needed:none  Is the patient able to pick up medications? Yes  Vitals: BP: 90/70         HR: 101-103 about 10 minutes after answering the door and sitting down/ RA sat 91-92%   118 while walking / 86-87% RA while walking   97 approx 5 mins after walking & while sitting/ 90%RA approx 5 mins after walking & while sitting  Weight: 184 lbs        Physical Exam:  Lung sounds:   Heart sounds: regular  Peripheral edema: yes  +3 RLE  +3 LLE  Wounds: no  Location:  Any patient concerns?  Concerned about the shob w/little exertion as well as his intermittent dry cough that makes him shob which makes him cough more.  Denies fever or other Covid-19 type symptoms.  Also wondering if he ought to wear his thicker or thinner compression stockings.   ReDS Vest/Clip Reading:  49%  Rhythm Strip: n/a  Spoke to Amy, NP at HF clinic via face time to provide findings.  Her recommendations were for him to take Torsemide 40mg  BID, wear the lighter compression stockings, come to the HF clinic in the morning around 10am for lab draw, and that Home O2 will be delivered and should be worn w/any exertion.  He stated understanding.     Vanita Ingles, RN 11/05/18

## 2018-11-05 NOTE — Progress Notes (Signed)
Heart Failure Clinic Note  Date:  11/05/2018   ID:  Adam Lewis, DOB 12-29-1945, MRN [id number]  Location: Home  Provider location: [place] [facility] Type of Visit: Established patient  PCP:  Adam School, MD  Cardiologist:  Adam Lesches, MD Primary HF: Bensimhon   History of Present Illness:  Adam Lewis is a 73 y.o. male with a history of hypertension and mixed hyperlipidemiainitially referred by Dr. 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   He underwent cardiac biopsy and RHC at [facility] which confirmed cardiac amyloid but not lare enough sample for mass spectrometry. Had UPEP and Urine IFE with m-spike and Lambda light chains.   He had a telehealth visit 09/30/18 and reported that he was doing well. Dr Adam Lewis set him up for bone marrow biopsy in IR, which came back normal. Tafamidis paperwork has begun.   He has called the HF clinic a few times over the last week with SOB and edema. Advised to double up torsemide. [facility] RN saw him yesterday and got ReDS reading of 49%. He was also hypoxic to 86-87% with ambulation. Weight had improved to 184 lbs. He was advised to double torsemide and to be seen in HF clinic for repeat ReDS and labs.  He presents today for follow up. His weight went up 3 lbs despite torsemide increase. He is more SOB, now with ADLs. This has been progressive since March. +BLE edema. +orthopnea. No PND. No dizziness except if he coughs too much, none with movement changes. +cough with clear sputum. No fever or chills. Appetite is good. Limiting fluid and salt intake. Weights have been fluctuating 179-189 lbs. He is 186 lbs today. ReDS reading is 50%.  He denies symptoms worrisome for COVID 19.   Studies  - Echo 8/19 EF 55-60% with moderate LVH restrictive physiology. RVSP 68mHG. - Cardiac catheterization 8/19 demonstrated normal coronary arteries.LVEDP 27.  - Chest CT scan 12/19: Moderate layering pleural effusions and  minimal pulmonary edema.Negative for pulmonary embolism or sarcoid. - cMRI 08/14/18 1. Septal hypertrophy normal LV size mild diffuse hypokinesis EF 49% 2. Markedly abnormal delayed gadolinium images with severe diffuse myocardial uptake suggesting severe infiltrative cardiomyopathy. Consider biopsy and genetic testing 3. Mild RV enlargement 4. Small circumferential pericardial effusion 5. Mild bi-atrial enlargement - PYP 3/20: Quantitative H/CL 1.58 but visually equivocal  Review of systems complete and found to be negative unless listed in HPI.   Past Medical History:  Diagnosis Date  . Essential hypertension   . Family history of adverse reaction to anesthesia    father died after [place] unaware of reason  . GERD (gastroesophageal reflux disease)   . Hypertriglyceridemia   . Osteoarthritis    Past Surgical History:  Procedure Laterality Date  . CARPAL TUNNEL RELEASE    . COLONOSCOPY  01/02/2011   Procedure: COLONOSCOPY;  Surgeon: MJamesetta Lewis  [facility];  Service: Gastroenterology;  Laterality: N/A;  . LEFT HEART CATH AND CORONARY ANGIOGRAPHY N/A 02/18/2018   Procedure: LEFT HEART CATH AND CORONARY ANGIOGRAPHY;  Surgeon: KTroy Sine MD;  [place];  Service: Cardiovascular;  Laterality: N/A;  . LITHOTRIPSY  05/2009  . 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 allopurinol (ZYLOPRIM) 100 MG tablet Take 100 mg by mouth daily.   3  . fish oil-omega-3 fatty acids 1000 MG capsule Take 2  g by mouth daily.      . lansoprazole (PREVACID) 30 MG capsule Take 30 mg by mouth daily.      Adam Lewis losartan (COZAAR) 25 MG tablet Take 1 tablet (25 mg total) by mouth daily. 90 tablet 3  . nitroGLYCERIN (NITROSTAT) 0.4 MG SL tablet Place 1 tablet (0.4 mg total) under the tongue every 5 (five) minutes as needed for chest pain. 25 tablet 3  . potassium chloride (K-DUR) 10  MEQ tablet Take 20 mEq by mouth daily.    . pravastatin (PRAVACHOL) 80 MG tablet Take 80 mg by mouth daily.   3  . torsemide (DEMADEX) 20 MG tablet Take 2 tablets (40 mg total) by mouth daily. 60 tablet 4   No current facility-administered medications for this encounter.     Allergies:   Bee venom   Social History:  The patient  reports that he quit smoking about 41 years ago. His smoking use included cigarettes. He has never used smokeless tobacco. He reports current alcohol use of about 16.0 standard drinks of alcohol per week. He reports that he does not use drugs.   Family History:  The patient's family history includes Heart disease in his father; Hypertension in his father.   Recent Labs: 07/07/2018: TSH 3.653 07/22/2018: ALT 25 10/24/2018: Hemoglobin 13.2; Platelets 245 11/05/2018: B Natriuretic Peptide 1,481.0; BUN 23; Creatinine, Ser 1.45; Potassium 3.6; Sodium 136  Personally reviewed   Wt Readings from Last 3 Encounters:  11/05/18 84.4 kg (186 lb)  10/16/18 86.5 kg (190 lb 12.8 oz)  08/27/18 86.8 kg (191 lb 6.4 oz)    Vitals:   11/05/18 1107  BP: (!) 96/50  Pulse: 92  SpO2: 96%   Exam:   General: SOB on exertion. Arrived in wheelchair. HEENT: Normal Neck: Supple. JVP 9-10. Carotids 2+ bilat; no bruits. No thyromegaly or nodule noted. Cor: PMI nondisplaced. RRR, No M/G/R noted Lungs: clear, no crackles or wheezing.  Abdomen: Soft, non-tender, non-distended, no HSM. No bruits or masses. +BS  Extremities: No cyanosis, clubbing, or rash. BLE 1-2+ edema. TED hose on.  Neuro: Alert & orientedx3, cranial nerves grossly intact. moves all 4 extremities w/o difficulty. 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  - NYHA worse, now IIIb. Volume elevated on exam and on ReDS (50%). Check BMET and BNP. - Give metolazone 2.5 mg today and tomorrow. Take 20 meq K with each dose. - Increase torsemide to 60 mg daily.  - Continue KCl 20 mg daily for now - Will send genetic testing today. We have started tafamidis paperwork today in clinic.   2. Hypoxia on exertion - O2 dropped to 86-87% on exertion during home visit yesterday. O2 sats okay today.  - Set up for home O2. I think this will be short term.  Follow up next week.  Signed, Georgiana Shore, NP  11/05/2018 11:12 AM  [address] [place] [id number] [phone] (office) [phone] (  fax)

## 2018-11-05 NOTE — Telephone Encounter (Signed)
Quick ref for respirotory and other services signed by DM faxed to [facility]

## 2018-11-05 NOTE — Progress Notes (Signed)
Order for oxygen and ref to [place] along with demographics and OV note all faxed to [place] at [phone]

## 2018-11-05 NOTE — Progress Notes (Signed)
Obtain salvia sample for genetic testing for TTR.  Sample and order sent to [facility] via [employer].  

## 2018-11-05 NOTE — Progress Notes (Signed)
ReDS Vest - 11/05/18 1100      ReDS Vest   Fitting Posture  Sitting    Height Marker  --   station C   Ruler Value  26    Center Strip  Aligned    ReDS Value  50      SATURATION QUALIFICATIONS: (This note is used to comply with regulatory documentation for home oxygen)  Patient Saturations on Room Air at Rest = 96%  Patient Saturations on Room Air while Ambulating = 87%  Patient Saturations on 2 Liters of oxygen while Ambulating = 97%  Please briefly explain why patient needs home oxygen: hypoxia, heart failure  Patient in for nurse visit today.  ReDS score 50%.  Discussed all info with Lillia Mountain, NP she will see pt and advise further recommendations.

## 2018-11-06 ENCOUNTER — Encounter (HOSPITAL_COMMUNITY): Payer: Self-pay | Admitting: Internal Medicine

## 2018-11-06 DIAGNOSIS — I5032 Chronic diastolic (congestive) heart failure: Secondary | ICD-10-CM | POA: Diagnosis not present

## 2018-11-10 ENCOUNTER — Encounter (HOSPITAL_COMMUNITY): Payer: Self-pay | Admitting: *Deleted

## 2018-11-10 ENCOUNTER — Telehealth (HOSPITAL_COMMUNITY): Payer: Self-pay | Admitting: *Deleted

## 2018-11-10 NOTE — Telephone Encounter (Signed)
Pt called stating his weight is up 4lbs in two days. Pts weight went down with last medication changes on 5/13.  Med changes 5/13 - Give metolazone 2.5 mg today and tomorrow. Take 20 meq K with each dose. - Increase torsemide to 60 mg daily.  - Continue KCl 20 mg daily for now  Message routed to [place] for advice

## 2018-11-10 NOTE — Telephone Encounter (Signed)
Called patient and kept getting a busy signal I sent pt a mychart message. See below  Good afternoon, I received your voice message about your 4lb weight gain over two days. I tried to call you back but I kept receiving a busy tone. Here are your medication changes Increase torsemide to 40 mg twice daily. Increase Potassium to 40 meq daily. Take metolazone 2.5 mg today with additional 20 meq of Potassium. Please call our office at [phone] to schedule lab visit next week and let me knwo you received this message.  Thank you

## 2018-11-10 NOTE — Telephone Encounter (Signed)
Increase torsemide to 40 mg BID. Increase KCl to 40 meq daily. He can repeat metolazone 2.5 mg today with additional 20 meq K. We need to repeat BMET next week. Thanks

## 2018-11-11 NOTE — Progress Notes (Addendum)
Heart Failure Clinic Note  Date:  11/12/2018   ID:  Adam Lewis, Adam Lewis 10-29-1945, MRN [id number]  Location: Home  Provider location: [place] [facility] Failure Type of Visit: Established patient  PCP:  Redmond School, MD  Cardiologist:  Rozann Lesches, MD Primary HF: Dr Adam Lewis   History of Present Illness:  Adam Lewis is a 73 y.o. male with a history of hypertension and mixed hyperlipidemiainitially referred by Dr. 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   He underwent cardiac biopsy and RHC at [facility] which confirmed cardiac amyloid but not lare enough sample for mass spectrometry. Had UPEP and Urine IFE with m-spike and Lambda light chains.   He had a telehealth visit 09/30/18 and reported that he was doing well. Dr Adam Lewis set him up for bone marrow biopsy in IR, which came back normal. Tafamidis paperwork has begun.   He has called the HF clinic a few times over the last week with SOB and edema. Advised to double up torsemide. [facility] RN saw him yesterday and got ReDS reading of 49%. He was also hypoxic to 86-87% with ambulation. Weight had improved to 184 lbs. He was advised to double torsemide and to be seen in HF clinic for repeat ReDS and labs.  He returns today for 1 week follow up. He was seen in HF clinic last week with ReDS reading of 50%. He was instructed to take metolazone x 2 days and daily torsemide was increased to 60 mg daily. He called HF clinic on Monday with 4 lb weight gain and was instructed to take additional metolazone x 1 and increase daily torsemide to 40 mg BID. Overall doing poorly today. He went down 1 lb with initial metolazone, but went back up 4-5 lbs. No increase in UOP. He still has BLE edema. +orthopnea. No PND. Appetite okay. Energy level very poor. He is SOB after walking 15 feet. +dry cough, occasional productive with clear sputum. + dizzy after coughing. Wearing O2 qHS. No fever or chills. No sick contacts. Limits fluid  intake. Weight is up 6 lbs on our scale.   ReDS 46% (improved from 50% last week)  He denies symptoms worrisome for COVID 19.   Studies  - Echo 8/19 EF 55-60% with moderate LVH restrictive physiology. RVSP 69mHG. - Cardiac catheterization 8/19 demonstrated normal coronary arteries.LVEDP 27.  - Chest CT scan 12/19: Moderate layering pleural effusions and minimal pulmonary edema.Negative for pulmonary embolism or sarcoid. - cMRI 08/14/18 1. Septal hypertrophy normal LV size mild diffuse hypokinesis EF 49% 2. Markedly abnormal delayed gadolinium images with severe diffuse myocardial uptake suggesting severe infiltrative cardiomyopathy. Consider biopsy and genetic testing 3. Mild RV enlargement 4. Small circumferential pericardial effusion 5. Mild bi-atrial enlargement - PYP 3/20: Quantitative H/CL 1.58 but visually equivocal  Review of systems complete and found to be negative unless listed in HPI.   Past Medical History:  Diagnosis Date   Essential hypertension    Family history of adverse reaction to anesthesia    father died after OHS in [place] unaware of reason   GERD (gastroesophageal reflux disease)    Hypertriglyceridemia    Osteoarthritis    Past Surgical History:  Procedure Laterality Date   CARPAL TUNNEL RELEASE     COLONOSCOPY  01/02/2011   Procedure: COLONOSCOPY;  Surgeon: MJamesetta So  Location: [facility];  Service: Gastroenterology;  Laterality: N/A;   LEFT HEART CATH AND CORONARY ANGIOGRAPHY N/A 02/18/2018   Procedure: LEFT HEART  CATH AND CORONARY ANGIOGRAPHY;  Surgeon: Troy Sine, MD;  Location: [place] [facility] LAB;  Service: Cardiovascular;  Laterality: N/A;   LITHOTRIPSY  05/2009   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   3   fish oil-omega-3 fatty acids 1000 MG capsule Take 2 g by  mouth daily.       lansoprazole (PREVACID) 30 MG capsule Take 30 mg by mouth daily.       losartan (COZAAR) 25 MG tablet Take 1 tablet (25 mg total) by mouth daily. 90 tablet 3   metolazone (ZAROXOLYN) 2.5 MG tablet Take 1 tablet (2.5 mg total) by mouth as directed. 5 tablet 3   nitroGLYCERIN (NITROSTAT) 0.4 MG SL tablet Place 1 tablet (0.4 mg total) under the tongue every 5 (five) minutes as needed for chest pain. 25 tablet 3   potassium chloride (K-DUR) 10 MEQ tablet Take 20 mEq by mouth 2 (two) times daily. Take 2 extra tabs when you take Metolazone     pravastatin (PRAVACHOL) 80 MG tablet Take 80 mg by mouth daily.   3   torsemide (DEMADEX) 20 MG tablet Take 80 mg by mouth daily.     No current facility-administered medications for this encounter.     Allergies:   Bee venom   Social History:  The patient  reports that he quit smoking about 41 years ago. His smoking use included cigarettes. He has never used smokeless tobacco. He reports current alcohol use of about 16.0 standard drinks of alcohol per week. He reports that he does not use drugs.   Family History:  The patient's family history includes Heart disease in his father; Hypertension in his father.   Recent Labs: 07/07/2018: TSH 3.653 07/22/2018: ALT 25 10/24/2018: Hemoglobin 13.2; Platelets 245 11/05/2018: B Natriuretic Peptide 1,481.0; BUN 23; Creatinine, Ser 1.45; Potassium 3.6; Sodium 136  Personally reviewed   Wt Readings from Last 3 Encounters:  11/12/18 87.3 kg (192 lb 6 oz)  11/05/18 84.4 kg (186 lb)  10/16/18 86.5 kg (190 lb 12.8 oz)    Vitals:   11/12/18 1121  BP: (!) 88/52  Pulse: 100  SpO2: 93%   Exam:    General: Arrived in wheelchair. SOB with talking.  HEENT: Normal Neck: Supple. JVP to jaw. Carotids 2+ bilat; no bruits. No thyromegaly or nodule noted. Cor: PMI nondisplaced. RRR, No M/G/R noted Lungs: diminished in RLL.  Abdomen: Soft, non-tender, non-distended, no HSM. No bruits or masses. +BS   Extremities: No cyanosis, clubbing, or rash. R and LLE 1+ in ankle Neuro: Alert & orientedx3, cranial nerves grossly intact. moves all 4 extremities w/o difficulty. 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  - NYHA worse IV. Volume elevated on exam and weight is up despite increased diuretics. ReDS 46% - Discussed admitting him for IV diuresis, but he declined. Will arrange for 80 mg IV lasix today and tomorrow with 40 meq K each  dose. He was given first dose of IV lasix with potassium today in clinic.  - Continue torsemide 40 mg BID + KCl 40 meq daily - Genetic testing pending. Tafamidis paperwork has been started  2. Hypoxia on exertion - O2 93% on RA today. He has home O2 now.   3. ?Pleural effusion - Check CXR  Declined admission. Will give him IV lasix today and tomorrow. Labs today. I will call him on Friday morning to see if he's doing better and if he needs more IV lasix or admission. Discussed all of the above with Dr Adam Lewis.    Georgiana Shore, NP  11/12/2018 11:33 AM  [facility] [address] [facility] [place] [address] [phone] (office) [phone] (fax)

## 2018-11-12 ENCOUNTER — Telehealth (HOSPITAL_COMMUNITY): Payer: Self-pay

## 2018-11-12 ENCOUNTER — Ambulatory Visit (HOSPITAL_BASED_OUTPATIENT_CLINIC_OR_DEPARTMENT_OTHER)
Admission: RE | Admit: 2018-11-12 | Discharge: 2018-11-12 | Disposition: A | Discharge status: Home / Self Care | Payer: PPO | Source: Ambulatory Visit | Attending: Cardiology | Admitting: Cardiology

## 2018-11-12 ENCOUNTER — Other Ambulatory Visit: Payer: Self-pay

## 2018-11-12 VITALS — BP 88/52 | HR 100 | Wt 192.4 lb

## 2018-11-12 DIAGNOSIS — I5022 Chronic systolic (congestive) heart failure: Secondary | ICD-10-CM

## 2018-11-12 DIAGNOSIS — J9611 Chronic respiratory failure with hypoxia: Secondary | ICD-10-CM | POA: Diagnosis present

## 2018-11-12 DIAGNOSIS — M1991 Primary osteoarthritis, unspecified site: Secondary | ICD-10-CM | POA: Diagnosis not present

## 2018-11-12 DIAGNOSIS — R05 Cough: Secondary | ICD-10-CM | POA: Diagnosis not present

## 2018-11-12 DIAGNOSIS — I361 Nonrheumatic tricuspid (valve) insufficiency: Secondary | ICD-10-CM | POA: Diagnosis not present

## 2018-11-12 DIAGNOSIS — R0902 Hypoxemia: Secondary | ICD-10-CM | POA: Insufficient documentation

## 2018-11-12 DIAGNOSIS — I5032 Chronic diastolic (congestive) heart failure: Secondary | ICD-10-CM

## 2018-11-12 DIAGNOSIS — K219 Gastro-esophageal reflux disease without esophagitis: Secondary | ICD-10-CM | POA: Diagnosis present

## 2018-11-12 DIAGNOSIS — J90 Pleural effusion, not elsewhere classified: Secondary | ICD-10-CM | POA: Diagnosis not present

## 2018-11-12 DIAGNOSIS — R570 Cardiogenic shock: Secondary | ICD-10-CM | POA: Diagnosis present

## 2018-11-12 DIAGNOSIS — J9620 Acute and chronic respiratory failure, unspecified whether with hypoxia or hypercapnia: Secondary | ICD-10-CM | POA: Diagnosis not present

## 2018-11-12 DIAGNOSIS — N179 Acute kidney failure, unspecified: Secondary | ICD-10-CM | POA: Diagnosis present

## 2018-11-12 DIAGNOSIS — E854 Organ-limited amyloidosis: Secondary | ICD-10-CM | POA: Diagnosis present

## 2018-11-12 DIAGNOSIS — I509 Heart failure, unspecified: Secondary | ICD-10-CM | POA: Diagnosis not present

## 2018-11-12 DIAGNOSIS — R0600 Dyspnea, unspecified: Secondary | ICD-10-CM | POA: Diagnosis not present

## 2018-11-12 DIAGNOSIS — I425 Other restrictive cardiomyopathy: Secondary | ICD-10-CM | POA: Diagnosis present

## 2018-11-12 DIAGNOSIS — I110 Hypertensive heart disease with heart failure: Secondary | ICD-10-CM | POA: Insufficient documentation

## 2018-11-12 DIAGNOSIS — E781 Pure hyperglyceridemia: Secondary | ICD-10-CM | POA: Insufficient documentation

## 2018-11-12 DIAGNOSIS — Z79899 Other long term (current) drug therapy: Secondary | ICD-10-CM | POA: Diagnosis not present

## 2018-11-12 DIAGNOSIS — R7989 Other specified abnormal findings of blood chemistry: Secondary | ICD-10-CM | POA: Diagnosis not present

## 2018-11-12 DIAGNOSIS — M1990 Unspecified osteoarthritis, unspecified site: Secondary | ICD-10-CM | POA: Insufficient documentation

## 2018-11-12 DIAGNOSIS — Z8249 Family history of ischemic heart disease and other diseases of the circulatory system: Secondary | ICD-10-CM | POA: Diagnosis not present

## 2018-11-12 DIAGNOSIS — C9000 Multiple myeloma not having achieved remission: Secondary | ICD-10-CM | POA: Diagnosis present

## 2018-11-12 DIAGNOSIS — E876 Hypokalemia: Secondary | ICD-10-CM | POA: Diagnosis not present

## 2018-11-12 DIAGNOSIS — Z1159 Encounter for screening for other viral diseases: Secondary | ICD-10-CM | POA: Diagnosis not present

## 2018-11-12 DIAGNOSIS — E782 Mixed hyperlipidemia: Secondary | ICD-10-CM | POA: Diagnosis present

## 2018-11-12 DIAGNOSIS — I340 Nonrheumatic mitral (valve) insufficiency: Secondary | ICD-10-CM | POA: Diagnosis not present

## 2018-11-12 DIAGNOSIS — F419 Anxiety disorder, unspecified: Secondary | ICD-10-CM | POA: Diagnosis present

## 2018-11-12 DIAGNOSIS — Z9981 Dependence on supplemental oxygen: Secondary | ICD-10-CM | POA: Diagnosis not present

## 2018-11-12 DIAGNOSIS — I130 Hypertensive heart and chronic kidney disease with heart failure and stage 1 through stage 4 chronic kidney disease, or unspecified chronic kidney disease: Secondary | ICD-10-CM | POA: Diagnosis present

## 2018-11-12 DIAGNOSIS — Z20828 Contact with and (suspected) exposure to other viral communicable diseases: Secondary | ICD-10-CM | POA: Diagnosis not present

## 2018-11-12 DIAGNOSIS — Z87891 Personal history of nicotine dependence: Secondary | ICD-10-CM | POA: Diagnosis not present

## 2018-11-12 DIAGNOSIS — N183 Chronic kidney disease, stage 3 (moderate): Secondary | ICD-10-CM | POA: Diagnosis present

## 2018-11-12 DIAGNOSIS — I43 Cardiomyopathy in diseases classified elsewhere: Secondary | ICD-10-CM

## 2018-11-12 DIAGNOSIS — I5033 Acute on chronic diastolic (congestive) heart failure: Secondary | ICD-10-CM | POA: Diagnosis present

## 2018-11-12 DIAGNOSIS — Z9181 History of falling: Secondary | ICD-10-CM | POA: Diagnosis not present

## 2018-11-12 DIAGNOSIS — I5023 Acute on chronic systolic (congestive) heart failure: Secondary | ICD-10-CM | POA: Diagnosis not present

## 2018-11-12 DIAGNOSIS — I959 Hypotension, unspecified: Secondary | ICD-10-CM | POA: Diagnosis not present

## 2018-11-12 DIAGNOSIS — E8581 Light chain (AL) amyloidosis: Secondary | ICD-10-CM | POA: Diagnosis present

## 2018-11-12 LAB — BASIC METABOLIC PANEL
Anion gap: 9 (ref 5–15)
BUN: 52 mg/dL — ABNORMAL HIGH (ref 8–23)
CO2: 32 mmol/L (ref 22–32)
Calcium: 9.2 mg/dL (ref 8.9–10.3)
Chloride: 94 mmol/L — ABNORMAL LOW (ref 98–111)
Creatinine, Ser: 1.86 mg/dL — ABNORMAL HIGH (ref 0.61–1.24)
GFR calc Af Amer: 41 mL/min — ABNORMAL LOW (ref >60)
GFR calc non Af Amer: 35 mL/min — ABNORMAL LOW (ref >60)
Glucose, Bld: 125 mg/dL — ABNORMAL HIGH (ref 70–99)
Potassium: 3.3 mmol/L — ABNORMAL LOW (ref 3.5–5.1)
Sodium: 135 mmol/L (ref 135–145)

## 2018-11-12 LAB — CBC
HCT: 37.2 % — ABNORMAL LOW (ref 39.0–52.0)
Hemoglobin: 12.6 g/dL — ABNORMAL LOW (ref 13.0–17.0)
MCH: 32.6 pg (ref 26.0–34.0)
MCHC: 33.9 g/dL (ref 30.0–36.0)
MCV: 96.4 fL (ref 80.0–100.0)
Platelets: 211 10*3/uL (ref 150–400)
RBC: 3.86 MIL/uL — ABNORMAL LOW (ref 4.22–5.81)
RDW: 12.5 % (ref 11.5–15.5)
WBC: 8.2 10*3/uL (ref 4.0–10.5)
nRBC: 0 % (ref 0.0–0.2)

## 2018-11-12 LAB — BRAIN NATRIURETIC PEPTIDE: B Natriuretic Peptide: 1422.7 pg/mL — ABNORMAL HIGH (ref 0.0–100.0)

## 2018-11-12 MED ORDER — POTASSIUM CHLORIDE CRYS ER 20 MEQ PO TBCR
40.0000 meq | EXTENDED_RELEASE_TABLET | Freq: Once | ORAL | Status: AC
  Administered 2018-11-12: 40 meq via ORAL
  Filled 2018-11-12: qty 2

## 2018-11-12 MED ORDER — FUROSEMIDE 10 MG/ML IJ SOLN
80.0000 mg | Freq: Once | INTRAMUSCULAR | Status: AC
  Administered 2018-11-12: 80 mg via INTRAVENOUS
  Filled 2018-11-12: qty 8

## 2018-11-12 MED ORDER — FUROSEMIDE 10 MG/ML IJ SOLN: 80.0000 mg | Freq: Once | INTRAMUSCULAR | 1 refills | Status: DC

## 2018-11-12 MED FILL — FUROSEMIDE 10 MG/ML SOLN: 10 | 1 days supply | Qty: 8 | Fill #0

## 2018-11-12 NOTE — Telephone Encounter (Signed)
Spoke with patient, he is aware of lab results and need to take 42meq potassium.  Pt will have labs redrawn via [facility].  Left message with Caryl Pina. She will communicate with Tiffany RN to arrange for labs next week.

## 2018-11-12 NOTE — Progress Notes (Signed)
LATE ENTRY:  80 mg IV Lasix and 40 meq PO of KCL was given to pt at 1305.  Pt tolerated well with no complaints.  IV was d/c'd afterwards, catheter intact.  Discharge instructions and f/u appts reviewed w/pt, he is aware and verbalizes understanding.  Advised if pt feels worse or changes his mind about being admitted to hospital to call us back, he is agreeable.  Order for IV Lasix sent to [facility] so they can ship to pt's home.  Spoke w/Tiffany, RN w/[facility] and she is aware [place] orders placed and need for IV Lasix.

## 2018-11-12 NOTE — Progress Notes (Signed)
ReDS Vest - 11/12/18 1100      ReDS Vest   Fitting Posture  Sitting    Height Marker  --   station C   Ruler Value  28.5    Center Strip  Aligned    ReDS Value  46

## 2018-11-12 NOTE — Addendum Note (Signed)
Encounter addended by: Scarlette Calico, RN on: 11/12/2018 3:22 PM  Actions taken: [facility] administration accepted, Pharmacy for encounter modified, Order list changed, Diagnosis association updated, Clinical Note Signed

## 2018-11-12 NOTE — Addendum Note (Signed)
Encounter addended by: Scarlette Calico, RN on: 11/12/2018 4:00 PM  Actions taken: Charge Capture section accepted

## 2018-11-12 NOTE — Patient Instructions (Addendum)
We have given you IV Furosemide (Lasix) today and Potassium  You do not need to take your Torsemide or Potassium today  [facility] will contact you about giving you another dose of IV Lasix  Ashely, NP will call you on Friday 5/22 AM to check on you  Your physician recommends that you schedule a follow-up appointment in: Wed 5/27 at 11:00 AM

## 2018-11-12 NOTE — Telephone Encounter (Signed)
-----   Message from Georgiana Shore, NP sent at 11/12/2018  1:20 PM EDT ----- Potassium low. Take additional 40 meq K today (in addition to the K he took with IV lasix). Renal function trending up, hopefully will improve with IV lasix. Please see if [facility] can recheck BMET next week. We have set him up to get IV lasix with [facility] tomorrow.

## 2018-11-12 NOTE — Addendum Note (Signed)
Encounter addended by: Scarlette Calico, RN on: 11/12/2018 1:21 PM  Actions taken: Clinical Note Signed

## 2018-11-12 NOTE — Telephone Encounter (Signed)
Spoke with Tiffany, BMET confirmed for Tuesday 5/26.  Order for BMET faxed to [facility] via [employer]. [phone]

## 2018-11-13 NOTE — Progress Notes (Signed)
Heart Failure TeleHealth Note  Due to national recommendations of social distancing due to [place] 19, telehealth visit is felt to be most appropriate for this patient at this time.  I discussed the limitations, risks, security and privacy concerns of performing an evaluation and management service by telephone and the availability of in person appointments. I also discussed with the patient that there may be a patient responsible charge related to this service. The patient expressed understanding and agreed to proceed.   ID:  Adam Lewis, DOB 1946/03/22, MRN [id number]  Location: Home  Provider location: [address] [place] Type of Visit: Established patient   PCP:  Redmond School, MD  Cardiologist:  Rozann Lesches, MD Primary HF: Dr Haroldine Laws  Chief Complaint: HF follow up   History of Present Illness: Adam Siever Ratcliffeis a 73 y.o.malewith a history of hypertension and mixed hyperlipidemiainitially referred by Dr. 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   He underwent cardiac biopsy and RHC at [facility] which confirmed cardiac amyloid but not lare enough sample for mass spectrometry. Had UPEP and Urine IFE with m-spike and Lambda light chains.   He had a telehealth visit 09/30/18 and reported that he was doing well. Dr Haroldine Laws set him up for bone marrow biopsy in IR, which came back normal. Tafamidis paperwork has begun.   Patient presents via audio conferencing for a telehealth visit today. He was seen earlier this week in clinic and give 80 mg IV lasix and arranged for 80 mg IV lasix the following day as well. Declined admission. Overall doing a little better. He received 80 mg IV lasix x1 in clinic. He has picked up IV lasix for home, but [facility] did not come out yesterday. He did not a lot of UOP, but weight is down 4 lbs today. His legs are less swollen. He is still SOB with minimal exertion. +dry cough. No fever/chills. +orthopnea. No PND. Wearing  O2 at home. BP at home improved 100/67 today. Eats take out once/week, otherwise limits salt.   Pt denies symptoms of cough, fevers, chills, or new SOB worrisome for COVID 19.    Studies  - Echo 8/19 EF 55-60% with moderate LVH restrictive physiology. RVSP 62mHG. - Cardiac catheterization 8/19 demonstrated normal coronary arteries.LVEDP 27.  - Chest CT scan 12/19: Moderate layering pleural effusions and minimal pulmonary edema.Negative for pulmonary embolism or sarcoid. - cMRI 08/14/18 1. Septal hypertrophy normal LV size mild diffuse hypokinesis EF 49% 2. Markedly abnormal delayed gadolinium images with severe diffuse myocardial uptake suggesting severe infiltrative cardiomyopathy. Consider biopsy and genetic testing 3. Mild RV enlargement 4. Small circumferential pericardial effusion 5. Mild bi-atrial enlargement - PYP 3/20: Quantitative H/CL 1.58 but visually equivocal  Past Medical History:  Diagnosis Date   Essential hypertension    Family history of adverse reaction to anesthesia    father died [place] reason   GERD (gastroesophageal reflux disease)    Hypertriglyceridemia    Osteoarthritis    Past Surgical History:  Procedure Laterality Date   CARPAL TUNNEL RELEASE     COLONOSCOPY  01/02/2011   Procedure: COLONOSCOPY;  Surgeon: MJamesetta So  [facility];  Service: Gastroenterology;  Laterality: N/A;   LEFT HEART CATH AND CORONARY ANGIOGRAPHY N/A 02/18/2018   Procedure: LEFT HEART CATH AND CORONARY ANGIOGRAPHY;  Surgeon: KTroy Sine MD;  [facility];  Service: Cardiovascular;  Laterality: N/A;   LITHOTRIPSY  05/2009  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     Current Outpatient Medications  Medication Sig Dispense Refill   losartan (COZAAR) 25 MG tablet Take 1 tablet (25 mg total) by mouth daily. 90 tablet 3   potassium chloride (K-DUR) 10 MEQ tablet Take 20 mEq by mouth 2  (two) times daily. Take 2 extra tabs when you take Metolazone     pravastatin (PRAVACHOL) 80 MG tablet Take 80 mg by mouth daily.   3   torsemide (DEMADEX) 20 MG tablet Take 40 mg by mouth 2 (two) times daily.      allopurinol (ZYLOPRIM) 100 MG tablet Take 100 mg by mouth daily.   3   fish oil-omega-3 fatty acids 1000 MG capsule Take 2 g by mouth daily.       furosemide (LASIX) 10 MG/ML injection Inject 8 mLs (80 mg total) into the vein once for 1 dose. 8 mL 1   lansoprazole (PREVACID) 30 MG capsule Take 30 mg by mouth daily.       metolazone (ZAROXOLYN) 2.5 MG tablet Take 1 tablet (2.5 mg total) by mouth as directed. 5 tablet 3   nitroGLYCERIN (NITROSTAT) 0.4 MG SL tablet Place 1 tablet (0.4 mg total) under the tongue every 5 (five) minutes as needed for chest pain. 25 tablet 3   No current facility-administered medications for this encounter.     Allergies:   Bee venom   Social History:  The patient  reports that he quit smoking about 41 years ago. His smoking use included cigarettes. He has never used smokeless tobacco. He reports current alcohol use of about 16.0 standard drinks of alcohol per week. He reports that he does not use drugs.   Family History:  The patient's family history includes Heart disease in his father; Hypertension in his father.   ROS:  Please see the history of present illness.   All other systems are personally reviewed and negative.    Exam:  (Video/Tele Health Call; Exam is subjective and or/visual.) General:  Speaks in full sentences. No resp difficulty. Lungs: Normal respiratory effort with conversation.  Abdomen: + distension per patient report Extremities: +BLE edema. Neuro: Alert & oriented x 3.   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  -NYHA IIIB. Volume improved, but still sounds elevated.  - He only received 80 mg IV lasix x1 in clinic. He has 80 mg IV lasix at home, but [facility] did not come yesterday. He needs to get 80 mg IV lasix with 40 meq K today. If [facility] cannot give it today, I will see if Vanita Ingles could possibly - Continue torsemide 40 mg BID + KCl 40  meq daily after lasix. Check BMET early next week through [facility]. - Genetic testing pending. Tafamidis paperwork has been started  2. Chronic hypoxic resp failure - Wears O2 PRN at home.   3. ?Pleural effusion - Need to check CXR if symptoms don't improve with diuresis. He declined last visit.   COVID screen The patient does not have any symptoms that suggest any further testing/ screening at this time.  Social distancing reinforced today.  Patient Risk: After full review of this patients clinical status, I feel that they are at moderate risk for cardiac decompensation at this time.  Orders/Follow up: Keep follow up with Dr Haroldine Laws next week.   Today, I have spent 18 minutes with the patient with telehealth technology discussing CHF and diuretics.    Signed, Georgiana Shore, NP  11/14/2018 8:55 AM   [facility] [address] [facility] and [place] [address] [phone] (office) [phone] (fax)

## 2018-11-14 ENCOUNTER — Other Ambulatory Visit: Payer: Self-pay

## 2018-11-14 ENCOUNTER — Inpatient Hospital Stay (HOSPITAL_COMMUNITY)
Admission: EM | Admit: 2018-11-14 | Discharge: 2018-11-19 | DRG: 291 | Disposition: A | Discharge status: Home / Self Care | Payer: PPO | Attending: Cardiology | Admitting: Cardiology

## 2018-11-14 ENCOUNTER — Telehealth (HOSPITAL_COMMUNITY): Payer: Self-pay | Admitting: *Deleted

## 2018-11-14 ENCOUNTER — Ambulatory Visit (HOSPITAL_BASED_OUTPATIENT_CLINIC_OR_DEPARTMENT_OTHER)
Admission: RE | Admit: 2018-11-14 | Discharge: 2018-11-14 | Disposition: A | Discharge status: Home / Self Care | Payer: PPO | Source: Ambulatory Visit | Attending: Cardiology | Admitting: Cardiology

## 2018-11-14 ENCOUNTER — Encounter (HOSPITAL_COMMUNITY): Payer: Self-pay

## 2018-11-14 ENCOUNTER — Encounter (HOSPITAL_COMMUNITY): Payer: Self-pay | Admitting: *Deleted

## 2018-11-14 ENCOUNTER — Emergency Department (HOSPITAL_COMMUNITY): Payer: PPO

## 2018-11-14 VITALS — BP 100/67 | Wt 183.0 lb

## 2018-11-14 DIAGNOSIS — Z87891 Personal history of nicotine dependence: Secondary | ICD-10-CM

## 2018-11-14 DIAGNOSIS — Z1159 Encounter for screening for other viral diseases: Secondary | ICD-10-CM | POA: Diagnosis not present

## 2018-11-14 DIAGNOSIS — I43 Cardiomyopathy in diseases classified elsewhere: Secondary | ICD-10-CM | POA: Diagnosis present

## 2018-11-14 DIAGNOSIS — C9000 Multiple myeloma not having achieved remission: Secondary | ICD-10-CM

## 2018-11-14 DIAGNOSIS — R570 Cardiogenic shock: Secondary | ICD-10-CM | POA: Diagnosis present

## 2018-11-14 DIAGNOSIS — I5022 Chronic systolic (congestive) heart failure: Secondary | ICD-10-CM

## 2018-11-14 DIAGNOSIS — I509 Heart failure, unspecified: Secondary | ICD-10-CM

## 2018-11-14 DIAGNOSIS — E8581 Light chain (AL) amyloidosis: Secondary | ICD-10-CM | POA: Diagnosis present

## 2018-11-14 DIAGNOSIS — N183 Chronic kidney disease, stage 3 (moderate): Secondary | ICD-10-CM | POA: Diagnosis present

## 2018-11-14 DIAGNOSIS — R7989 Other specified abnormal findings of blood chemistry: Secondary | ICD-10-CM | POA: Diagnosis not present

## 2018-11-14 DIAGNOSIS — I5033 Acute on chronic diastolic (congestive) heart failure: Secondary | ICD-10-CM | POA: Diagnosis present

## 2018-11-14 DIAGNOSIS — E876 Hypokalemia: Secondary | ICD-10-CM | POA: Diagnosis not present

## 2018-11-14 DIAGNOSIS — Z8249 Family history of ischemic heart disease and other diseases of the circulatory system: Secondary | ICD-10-CM

## 2018-11-14 DIAGNOSIS — E782 Mixed hyperlipidemia: Secondary | ICD-10-CM | POA: Diagnosis present

## 2018-11-14 DIAGNOSIS — Z79899 Other long term (current) drug therapy: Secondary | ICD-10-CM | POA: Diagnosis not present

## 2018-11-14 DIAGNOSIS — J9611 Chronic respiratory failure with hypoxia: Secondary | ICD-10-CM | POA: Diagnosis present

## 2018-11-14 DIAGNOSIS — F419 Anxiety disorder, unspecified: Secondary | ICD-10-CM | POA: Diagnosis present

## 2018-11-14 DIAGNOSIS — J9620 Acute and chronic respiratory failure, unspecified whether with hypoxia or hypercapnia: Secondary | ICD-10-CM | POA: Diagnosis not present

## 2018-11-14 DIAGNOSIS — I5023 Acute on chronic systolic (congestive) heart failure: Secondary | ICD-10-CM

## 2018-11-14 DIAGNOSIS — E854 Organ-limited amyloidosis: Secondary | ICD-10-CM | POA: Diagnosis present

## 2018-11-14 DIAGNOSIS — I340 Nonrheumatic mitral (valve) insufficiency: Secondary | ICD-10-CM | POA: Diagnosis not present

## 2018-11-14 DIAGNOSIS — K219 Gastro-esophageal reflux disease without esophagitis: Secondary | ICD-10-CM | POA: Diagnosis present

## 2018-11-14 DIAGNOSIS — I130 Hypertensive heart and chronic kidney disease with heart failure and stage 1 through stage 4 chronic kidney disease, or unspecified chronic kidney disease: Principal | ICD-10-CM | POA: Diagnosis present

## 2018-11-14 DIAGNOSIS — Z9981 Dependence on supplemental oxygen: Secondary | ICD-10-CM | POA: Diagnosis not present

## 2018-11-14 DIAGNOSIS — N179 Acute kidney failure, unspecified: Secondary | ICD-10-CM | POA: Diagnosis present

## 2018-11-14 DIAGNOSIS — I959 Hypotension, unspecified: Secondary | ICD-10-CM | POA: Diagnosis not present

## 2018-11-14 DIAGNOSIS — M1991 Primary osteoarthritis, unspecified site: Secondary | ICD-10-CM | POA: Diagnosis not present

## 2018-11-14 DIAGNOSIS — Z9181 History of falling: Secondary | ICD-10-CM | POA: Diagnosis not present

## 2018-11-14 DIAGNOSIS — I361 Nonrheumatic tricuspid (valve) insufficiency: Secondary | ICD-10-CM | POA: Diagnosis not present

## 2018-11-14 DIAGNOSIS — I425 Other restrictive cardiomyopathy: Secondary | ICD-10-CM | POA: Diagnosis present

## 2018-11-14 LAB — CBC
HCT: 36.6 % — ABNORMAL LOW (ref 39.0–52.0)
Hemoglobin: 12.4 g/dL — ABNORMAL LOW (ref 13.0–17.0)
MCH: 32.6 pg (ref 26.0–34.0)
MCHC: 33.9 g/dL (ref 30.0–36.0)
MCV: 96.3 fL (ref 80.0–100.0)
Platelets: 229 10*3/uL (ref 150–400)
RBC: 3.8 MIL/uL — ABNORMAL LOW (ref 4.22–5.81)
RDW: 12.5 % (ref 11.5–15.5)
WBC: 7.5 10*3/uL (ref 4.0–10.5)
nRBC: 0 % (ref 0.0–0.2)

## 2018-11-14 LAB — BASIC METABOLIC PANEL
Anion gap: 15 (ref 5–15)
BUN: 55 mg/dL — ABNORMAL HIGH (ref 8–23)
CO2: 30 mmol/L (ref 22–32)
Calcium: 9.5 mg/dL (ref 8.9–10.3)
Chloride: 91 mmol/L — ABNORMAL LOW (ref 98–111)
Creatinine, Ser: 1.91 mg/dL — ABNORMAL HIGH (ref 0.61–1.24)
GFR calc Af Amer: 40 mL/min — ABNORMAL LOW (ref >60)
GFR calc non Af Amer: 34 mL/min — ABNORMAL LOW (ref >60)
Glucose, Bld: 112 mg/dL — ABNORMAL HIGH (ref 70–99)
Potassium: 3.8 mmol/L (ref 3.5–5.1)
Sodium: 136 mmol/L (ref 135–145)

## 2018-11-14 MED ORDER — HEPARIN SODIUM (PORCINE) 5000 UNIT/ML IJ SOLN
5000.0000 [IU] | Freq: Three times a day (TID) | INTRAMUSCULAR | Status: DC
  Administered 2018-11-15 – 2018-11-19 (×13): 5000 [IU] via SUBCUTANEOUS
  Filled 2018-11-14 (×11): qty 1

## 2018-11-14 MED ORDER — ONDANSETRON HCL 4 MG/2ML IJ SOLN: 4.0000 mg | Freq: Four times a day (QID) | INTRAMUSCULAR | Status: DC | PRN

## 2018-11-14 MED ORDER — ACETAMINOPHEN 325 MG PO TABS: 650.0000 mg | ORAL_TABLET | ORAL | Status: DC | PRN

## 2018-11-14 MED ORDER — SODIUM CHLORIDE 0.9% FLUSH: 3.0000 mL | INTRAVENOUS | Status: DC | PRN

## 2018-11-14 MED ORDER — SODIUM CHLORIDE 0.9% FLUSH
3.0000 mL | Freq: Two times a day (BID) | INTRAVENOUS | Status: DC
  Administered 2018-11-15 – 2018-11-17 (×6): 3 mL via INTRAVENOUS
  Administered 2018-11-17: 6 mL via INTRAVENOUS
  Administered 2018-11-18 – 2018-11-19 (×3): 3 mL via INTRAVENOUS

## 2018-11-14 MED ORDER — SODIUM CHLORIDE 0.9 % IV SOLN: 250.0000 mL | INTRAVENOUS | Status: DC | PRN

## 2018-11-14 NOTE — Telephone Encounter (Signed)
Tiffany called to let us know that when the RN went to pt's home to administer the IV Lasix he really wasn't feeling good at all and his BP was "very low" although she is unsure of the reading.  She states pt was very SOB and so was advised to report to ER and was currently en route.  Info was sent to Dr Haroldine Laws

## 2018-11-14 NOTE — ED Provider Notes (Signed)
[facility] EMERGENCY DEPARTMENT Provider Note   CSN: [id number] Arrival date & time: 11/14/18  1731    History   Chief Complaint Chief Complaint  Patient presents with  . Hypotension    HPI Adam Lewis is a 73 y.o. male.     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  The patient reportedly has had increasing swelling and shortness of breath over the last several weeks.  He saw Dr. Haroldine Laws and was given a dose of IV Lasix several days ago.  He was due to receive a dose yesterday but this was not given as there was difficulty with the orders.  Patient reportedly had a home health nurse come check on him today and she noted that his blood pressure was in the 02O systolic and that he was satting in the 80s.  His hypoxia is significantly worse than usual.  He states he feels generally fatigued.  He does endorse some mild lightheadedness upon standing and with exertion.  He had a cardiology visit with Dr. Algernon Huxley today, and was told to come to the ER.  He denies any fevers.  No sputum production.  No other acute complaints.  Symptoms are worse with movement.  No leaving factors.  Past Medical History:  Diagnosis Date  . Essential hypertension   . Family history of adverse reaction to anesthesia    father died after OHS in [place], unaware of reason  . GERD (gastroesophageal reflux disease)   . Hypertriglyceridemia   . Osteoarthritis     Patient Active Problem List   Diagnosis Date Noted  . Heart failure ([place]) 11/14/2018  . Abnormal myocardial perfusion study   . Pain in right knee 05/26/2013  . Stiffness of right knee 05/26/2013    Past Surgical History:  Procedure Laterality Date  . CARPAL TUNNEL RELEASE    . COLONOSCOPY  01/02/2011   Procedure: COLONOSCOPY;  Surgeon: Jamesetta So;  [facility];  Service: Gastroenterology;  Laterality:  N/A;  . LEFT HEART CATH AND CORONARY ANGIOGRAPHY N/A 02/18/2018   Procedure: LEFT HEART CATH AND CORONARY ANGIOGRAPHY;  Surgeon: Troy Sine, MD;  Location: [facility];  Service: Cardiovascular;  Laterality: N/A;  . LITHOTRIPSY  05/2009  . 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        Home Medications    Prior to Admission medications   Medication Sig Start Date End Date Taking? Authorizing Provider  allopurinol (ZYLOPRIM) 100 MG tablet Take 100 mg by mouth daily.  09/30/17  Yes [provider]  fish oil-omega-3 fatty acids 1000 MG capsule Take 1 g by mouth daily.    Yes [provider]  lansoprazole (PREVACID) 30 MG capsule Take 30 mg by mouth daily.     Yes [provider]  losartan (COZAAR) 25 MG tablet Take 1 tablet (25 mg total) by mouth daily. 07/07/18 11/05/19 Yes Satira Sark, MD  metolazone (ZAROXOLYN) 2.5 MG tablet Take 1 tablet (2.5 mg total) by mouth as directed. Patient taking differently: Take 2.5 mg by mouth See admin instructions. Take one tablet (2.5 mg) by mouth when directed to do so by MD 11/05/18 02/03/19 Yes Georgiana Shore, NP  nitroGLYCERIN (NITROSTAT) 0.4 MG SL tablet Place 1 tablet (0.4 mg total) under the tongue every 5 (five) minutes as needed for chest pain. 12/18/17  Yes Stout, Fransisco Hertz,  PA-C  potassium chloride (K-DUR) 10 MEQ tablet Take 20 mEq by mouth 2 (two) times daily.    Yes [provider]  pravastatin (PRAVACHOL) 80 MG tablet Take 80 mg by mouth daily.  09/24/17  Yes [provider]  torsemide (DEMADEX) 20 MG tablet Take 40 mg by mouth 2 (two) times daily.    Yes [provider]  furosemide (LASIX) 10 MG/ML injection Inject 8 mLs (80 mg total) into the vein once for 1 dose. 11/12/18 11/12/18  Georgiana Shore, NP    Family History Family History  Problem Relation Age of Onset  . Hypertension Father   . Heart disease Father     Social History Social History   Tobacco Use   . Smoking status: Former Smoker    Types: Cigarettes    Last attempt to quit: 07/16/1977    Years since quitting: 41.3  . Smokeless tobacco: Never Used  Substance Use Topics  . Alcohol use: Yes    Alcohol/week: 16.0 standard drinks    Types: 1 Glasses of wine, 14 Cans of beer, 1 Shots of liquor per week  . Drug use: No     Allergies   Bee venom   Review of Systems Review of Systems  Constitutional: Positive for fatigue. Negative for chills and fever.  HENT: Negative for congestion and rhinorrhea.   Eyes: Negative for visual disturbance.  Respiratory: Positive for cough. Negative for shortness of breath and wheezing.   Cardiovascular: Positive for leg swelling. Negative for chest pain.  Gastrointestinal: Negative for abdominal pain, diarrhea, nausea and vomiting.  Genitourinary: Negative for dysuria and flank pain.  Musculoskeletal: Negative for neck pain and neck stiffness.  Skin: Negative for rash and wound.  Allergic/Immunologic: Negative for immunocompromised state.  Neurological: Positive for weakness. Negative for syncope and headaches.  All other systems reviewed and are negative.    Physical Exam Updated Vital Signs BP (!) 72/49 (BP Location: Left Arm)   Pulse 95   Temp 98.5 F (36.9 C) (Oral)   Resp (!) 23   Ht 5\' 9"  (1.753 m)   Wt 85.4 kg   SpO2 95%   BMI 27.80 kg/m   Physical Exam Vitals signs and nursing note reviewed.  Constitutional:      General: He is not in acute distress.    Appearance: He is well-developed.  HENT:     Head: Normocephalic and atraumatic.  Eyes:     Conjunctiva/sclera: Conjunctivae normal.  Neck:     Musculoskeletal: Neck supple.  Cardiovascular:     Rate and Rhythm: Normal rate and regular rhythm.     Heart sounds: Normal heart sounds. No murmur. No friction rub.  Pulmonary:     Effort: Pulmonary effort is normal. Tachypnea present. No respiratory distress.     Breath sounds: Examination of the right-lower field reveals  rales. Examination of the left-lower field reveals rales. Rales present. No wheezing.  Abdominal:     General: There is no distension.     Palpations: Abdomen is soft.     Tenderness: There is no abdominal tenderness.  Skin:    General: Skin is warm.     Capillary Refill: Capillary refill takes less than 2 seconds.  Neurological:     Mental Status: He is alert and oriented to person, place, and time.     Motor: No abnormal muscle tone.      ED Treatments / Results  Labs (all labs ordered are listed, but only abnormal results are  displayed) Labs Reviewed  BASIC METABOLIC PANEL - Abnormal; Notable for the following components:      Result Value   Chloride 91 (*)    Glucose, Bld 112 (*)    BUN 55 (*)    Creatinine, Ser 1.91 (*)    GFR calc non Af Amer 34 (*)    GFR calc Af Amer 40 (*)    All other components within normal limits  CBC - Abnormal; Notable for the following components:   RBC 3.80 (*)    Hemoglobin 12.4 (*)    HCT 36.6 (*)    All other components within normal limits  BRAIN NATRIURETIC PEPTIDE - Abnormal; Notable for the following components:   B Natriuretic Peptide 1,479.4 (*)    All other components within normal limits  TROPONIN I - Abnormal; Notable for the following components:   Troponin I 0.12 (*)    All other components within normal limits  LACTIC ACID, PLASMA - Abnormal; Notable for the following components:   Lactic Acid, Venous 2.3 (*)    All other components within normal limits  BASIC METABOLIC PANEL - Abnormal; Notable for the following components:   Potassium 3.4 (*)    Chloride 91 (*)    Glucose, Bld 162 (*)    BUN 54 (*)    Creatinine, Ser 1.96 (*)    GFR calc non Af Amer 33 (*)    GFR calc Af Amer 38 (*)    All other components within normal limits  GLUCOSE, CAPILLARY - Abnormal; Notable for the following components:   Glucose-Capillary 154 (*)    All other components within normal limits  SARS CORONAVIRUS 2 (HOSPITAL ORDER, [place] [facility])  MRSA PCR SCREENING  TSH  MAGNESIUM  APTT  PROTIME-INR  LACTIC ACID, PLASMA  LACTIC ACID, PLASMA    EKG None  Radiology Dg Chest 2 View  Result Date: 11/14/2018 CLINICAL DATA:  Hypotension EXAM: CHEST - 2 VIEW COMPARISON:  Chest x-ray dated 07/30/2018 FINDINGS: The cardiac silhouette is mildly enlarged. There are new small bilateral pleural effusions, right worse than left. There are bibasilar airspace opacities favored to represent compressive atelectasis. There is generalized volume overload. No pneumothorax. The pulmonary arteries appears somewhat dilated which can be seen in patients with elevated PA pressures. There is no acute osseous abnormality. IMPRESSION: 1. Small bilateral pleural effusions, right worse than left. These have increased significantly since prior chest x-ray. 2. Borderline enlarged heart.  Generalized volume overload is noted. 3. Bibasilar airspace opacities favored to represent compressive atelectasis. Electronically Signed   By: Constance Holster M.D.   On: 11/14/2018 18:35    Procedures Ultrasound ED Peripheral IV (Provider) Date/Time: 11/15/2018 10:17 AM Performed by: Duffy Bruce, MD Authorized by: Duffy Bruce, MD   Procedure details:    Indications: multiple failed IV attempts     Skin Prep: chlorhexidine gluconate     Location:  Right forearm   Angiocath:  20 G   Bedside Ultrasound Guided: Yes     Images: archived     Patient tolerated procedure without complications: Yes     Dressing applied: Yes     (including critical care time)  Medications Ordered in ED Medications  sodium chloride flush (NS) 0.9 % injection 3 mL (3 mLs Intravenous Given 11/15/18 0842)  sodium chloride flush (NS) 0.9 % injection 3 mL (has no administration in time range)  0.9 %  sodium chloride infusion (has no administration in time range)  acetaminophen (  TYLENOL) tablet 650 mg (has no administration in time range)  ondansetron  (ZOFRAN) injection 4 mg (has no administration in time range)  heparin injection 5,000 Units (5,000 Units Subcutaneous Given 11/15/18 0526)  allopurinol (ZYLOPRIM) tablet 100 mg (100 mg Oral Given 11/15/18 0842)  pravastatin (PRAVACHOL) tablet 80 mg (80 mg Oral Given 11/15/18 0842)  pantoprazole (PROTONIX) EC tablet 40 mg (40 mg Oral Given 11/15/18 0841)  omega-3 acid ethyl esters (LOVAZA) capsule 1 g (1 g Oral Given 11/15/18 0841)  aspirin chewable tablet 81 mg (81 mg Oral Given 11/15/18 0842)  norepinephrine (LEVOPHED) 4mg  in 222mL premix infusion (5 mcg/min Intravenous Rate/Dose Verify 11/15/18 0800)  loratadine (CLARITIN) tablet 10 mg (10 mg Oral Given 11/15/18 1010)  0.9 %  sodium chloride infusion ( Intravenous Stopped 11/15/18 0508)  potassium chloride SA (K-DUR) CR tablet 40 mEq (40 mEq Oral Given 11/15/18 1010)     Initial Impression / Assessment and Plan / ED Course  I have reviewed the triage vital signs and the nursing notes.  Pertinent labs & imaging results that were available during my care of the patient were reviewed by me and considered in my medical decision making (see chart for details).        73 yo M here with persistent leg edema, SOB, and mild hypotension. I suspect this is all 2/2 decompensated CHF with use of IV lasix contributing to mild hypotension. No fever, no leukocytosis, no sx to suggest infectious process. IV placed but will need very cautious diuresis/fluid management. Admit to Cardiology.  Final Clinical Impressions(s) / ED Diagnoses   Final diagnoses:  Acute on chronic systolic congestive heart failure [facility])    ED Discharge Orders    None       Duffy Bruce, MD 11/15/18 1018

## 2018-11-14 NOTE — ED Triage Notes (Signed)
Pt here after home health nurse came to house today to give him Lasix and found him to be 80% on room air and bp was 84/50. 96% in triage. Did not administer lasix. Increased swelling in bilateral feet. Pt of Dr. Haroldine Laws.

## 2018-11-14 NOTE — ED Notes (Signed)
Pt. sats at 87% on room air, no complaints of SOB. This tech placed him on a nasal cannula at 2L sats at 96% now.

## 2018-11-14 NOTE — ED Notes (Signed)
ED TO INPATIENT HANDOFF REPORT  ED Nurse Name and Phone #: Alveta Heimlich RN [phone]  S Name/Age/Gender Adam Lewis 73 y.o. male Room/Bed: 034C/034C  Code Status   Code Status: Full Code  Home/SNF/Other Home Patient oriented to: self, place, time and situation Is this baseline? Yes   Triage Complete: Triage complete  Chief Complaint Hypotenstion, Low O2 stats  Triage Note Pt here after home health nurse came to house today to give him Lasix and found him to be 80% on room air and bp was 84/50. 96% in triage. Did not administer lasix. Increased swelling in bilateral feet. Pt of Dr. Haroldine Laws.   Allergies Allergies  Allergen Reactions  . Bee Venom Hives    Level of Care/Admitting Diagnosis ED Disposition    ED Disposition Condition [facility]: [place] [[id number]]  Level of Care: Progressive [102]  Covid Evaluation: Person Under Investigation (PUI)  Isolation Risk Level: Low Risk/Droplet (Less than 4L [place] supplementation)  Diagnosis: Heart failure [facility]) [[id number]]  Admitting Physician: Bryna Colander [phone]  Attending Physician: Dorothy Spark [[id number]]  Estimated length of stay: past midnight tomorrow  Certification:: I certify this patient will need inpatient services for at least 2 midnights  PT Class (Do Not Modify): Inpatient [101]  PT Acc Code (Do Not Modify): Private [1]       B Medical/Surgery History Past Medical History:  Diagnosis Date  . Essential hypertension   . Family history of adverse reaction to anesthesia    father died after OHS in [place], unaware of reason  . GERD (gastroesophageal reflux disease)   . Hypertriglyceridemia   . Osteoarthritis    Past Surgical History:  Procedure Laterality Date  . CARPAL TUNNEL RELEASE    . COLONOSCOPY  01/02/2011   Procedure: COLONOSCOPY;  Surgeon: Jamesetta So;  Location: [facility];  Service: Gastroenterology;  Laterality: N/A;  . LEFT HEART CATH  AND CORONARY ANGIOGRAPHY N/A 02/18/2018   Procedure: LEFT HEART CATH AND CORONARY ANGIOGRAPHY;  Surgeon: Troy Sine, MD;  Location: [facility];  Service: Cardiovascular;  Laterality: N/A;  . LITHOTRIPSY  05/2009  . 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     A IV Location/Drains/Wounds Patient Lines/Drains/Airways Status   Active Line/Drains/Airways    Name:   Placement date:   Placement time:   Site:   Days:   Peripheral IV 11/12/18 Left Hand   11/12/18    1255    Hand   2          Intake/Output Last 24 hours No intake or output data in the 24 hours ending 11/14/18 2230  Labs/Imaging Results for orders placed or performed during the hospital encounter of 11/14/18 (from the past 48 hour(s))  Basic metabolic panel     Status: Abnormal   Collection Time: 11/14/18  5:55 PM  Result Value Ref Range   Sodium 136 135 - 145 mmol/L   Potassium 3.8 3.5 - 5.1 mmol/L   Chloride 91 (L) 98 - 111 mmol/L   CO2 30 22 - 32 mmol/L   Glucose, Bld 112 (H) 70 - 99 mg/dL   BUN 55 (H) 8 - 23 mg/dL   Creatinine, Ser 1.91 (H) 0.61 - 1.24 mg/dL   Calcium 9.5 8.9 - 10.3 mg/dL   GFR calc non Af Amer 34 (L) >60 mL/min   GFR calc Af Amer 40 (L) >60 mL/min   Anion gap 15 5 - 15  Comment: Performed at [facility], [place] [address] [place], [place] [address]  CBC     Status: Abnormal   Collection Time: 11/14/18  5:55 PM  Result Value Ref Range   WBC 7.5 4.0 - 10.5 K/uL   RBC 3.80 (L) 4.22 - 5.81 MIL/uL   Hemoglobin 12.4 (L) 13.0 - 17.0 g/dL   HCT 36.6 (L) 39.0 - 52.0 %   MCV 96.3 80.0 - 100.0 fL   MCH 32.6 26.0 - 34.0 pg   MCHC 33.9 30.0 - 36.0 g/dL   RDW 12.5 11.5 - 15.5 %   Platelets 229 150 - 400 K/uL   nRBC 0.0 0.0 - 0.2 %    Comment: Performed at [facility], [place] [address] [place], [place] [address]   Dg Chest 2 View  Result Date: 11/14/2018 CLINICAL DATA:  Hypotension EXAM: CHEST - 2 VIEW COMPARISON:  Chest x-ray dated 07/30/2018 FINDINGS: The cardiac  silhouette is mildly enlarged. There are new small bilateral pleural effusions, right worse than left. There are bibasilar airspace opacities favored to represent compressive atelectasis. There is generalized volume overload. No pneumothorax. The pulmonary arteries appears somewhat dilated which can be seen in patients with elevated PA pressures. There is no acute osseous abnormality. IMPRESSION: 1. Small bilateral pleural effusions, right worse than left. These have increased significantly since prior chest x-ray. 2. Borderline enlarged heart.  Generalized volume overload is noted. 3. Bibasilar airspace opacities favored to represent compressive atelectasis. Electronically Signed   By: Constance Holster M.D.   On: 11/14/2018 18:35    Pending Labs Unresulted Labs (From admission, onward)    Start     Ordered   11/15/18 7902  Basic metabolic panel  Daily,   R     11/14/18 2219   11/14/18 2218  TSH  Once,   R     11/14/18 2219   11/14/18 2218  Brain natriuretic peptide  Once,   R     11/14/18 2219   11/14/18 2218  Magnesium  Once,   R     11/14/18 2219   11/14/18 2218  APTT  Once,   R     11/14/18 2219   11/14/18 2218  [place]  Once,   R     11/14/18 2219   11/14/18 2215  CBC  (heparin)  Once,   R    Comments:  Baseline for heparin therapy IF NOT ALREADY DRAWN.  Notify MD if PLT < 100 K.    11/14/18 2219   11/14/18 2215  Creatinine, serum  (heparin)  Once,   R    Comments:  Baseline for heparin therapy IF NOT ALREADY DRAWN.    11/14/18 2219   11/14/18 2154  SARS Coronavirus 2 (CEPHEID- Performed in [place] hospital lab), Hosp Order  (Symptomatic Patients Labs with Precautions )  Once,   R    Question:  Patient immune status  Answer:  Normal   11/14/18 2153   11/14/18 2152  Brain natriuretic peptide  Once,   R     11/14/18 2151   11/14/18 2152  Troponin I - ONCE - STAT  ONCE - STAT,   STAT     11/14/18 2151   11/14/18 2152  Lactic acid, plasma  Now then every 2 hours,   STAT      11/14/18 2151          Vitals/Pain Today's Vitals   11/14/18 1744 11/14/18 1746 11/14/18 2117 11/14/18 2130  BP: (!) 88/63  99/67 106/75  Pulse: 94  96 98  Resp: 16  19 (!) 21  Temp: 98.6 F (37 C)  98.3 F (36.8 C)   TempSrc: Oral  Oral   SpO2: 95%  96% 96%  Weight:  83 kg    Height:  5\' 9"  (1.753 m)    PainSc:  0-No pain      Isolation Precautions Droplet and Contact precautions  Medications Medications  sodium chloride flush (NS) 0.9 % injection 3 mL (has no administration in time range)  sodium chloride flush (NS) 0.9 % injection 3 mL (has no administration in time range)  0.9 %  sodium chloride infusion (has no administration in time range)  acetaminophen (TYLENOL) tablet 650 mg (has no administration in time range)  ondansetron (ZOFRAN) injection 4 mg (has no administration in time range)  heparin injection 5,000 Units (has no administration in time range)    Mobility walks with device Moderate fall risk   Focused Assessments Pulmonary Assessment Handoff:  Lung sounds:   O2 Device: Nasal Cannula O2 Flow Rate (L/min): 2 L/min      R Recommendations: See Admitting Provider Note  Report given to:   Additional Notes:

## 2018-11-14 NOTE — Progress Notes (Signed)
Received TTR results from [facility]:  Result:  NEGATIVE  Info sent to Dr Haroldine Laws

## 2018-11-15 ENCOUNTER — Inpatient Hospital Stay (HOSPITAL_COMMUNITY): Payer: PPO

## 2018-11-15 DIAGNOSIS — I361 Nonrheumatic tricuspid (valve) insufficiency: Secondary | ICD-10-CM

## 2018-11-15 DIAGNOSIS — I5031 Acute diastolic (congestive) heart failure: Secondary | ICD-10-CM

## 2018-11-15 DIAGNOSIS — I509 Heart failure, unspecified: Secondary | ICD-10-CM

## 2018-11-15 DIAGNOSIS — I959 Hypotension, unspecified: Secondary | ICD-10-CM

## 2018-11-15 DIAGNOSIS — I340 Nonrheumatic mitral (valve) insufficiency: Secondary | ICD-10-CM

## 2018-11-15 DIAGNOSIS — R7989 Other specified abnormal findings of blood chemistry: Secondary | ICD-10-CM

## 2018-11-15 LAB — BASIC METABOLIC PANEL
Anion gap: 15 (ref 5–15)
BUN: 54 mg/dL — ABNORMAL HIGH (ref 8–23)
CO2: 30 mmol/L (ref 22–32)
Calcium: 9.4 mg/dL (ref 8.9–10.3)
Chloride: 91 mmol/L — ABNORMAL LOW (ref 98–111)
Creatinine, Ser: 1.96 mg/dL — ABNORMAL HIGH (ref 0.61–1.24)
GFR calc Af Amer: 38 mL/min — ABNORMAL LOW (ref >60)
GFR calc non Af Amer: 33 mL/min — ABNORMAL LOW (ref >60)
Glucose, Bld: 162 mg/dL — ABNORMAL HIGH (ref 70–99)
Potassium: 3.4 mmol/L — ABNORMAL LOW (ref 3.5–5.1)
Sodium: 136 mmol/L (ref 135–145)

## 2018-11-15 LAB — TSH: TSH: 3.125 u[IU]/mL (ref 0.350–4.500)

## 2018-11-15 LAB — MRSA PCR SCREENING: MRSA by PCR: NEGATIVE

## 2018-11-15 LAB — LACTIC ACID, PLASMA
Lactic Acid, Venous: 2.3 mmol/L (ref 0.5–1.9)
Lactic Acid, Venous: 1.5 mmol/L (ref 0.5–1.9)
Lactic Acid, Venous: 1.9 mmol/L (ref 0.5–1.9)

## 2018-11-15 LAB — PROTIME-INR
INR: 1.2 (ref 0.8–1.2)
Prothrombin Time: 14.9 seconds (ref 11.4–15.2)

## 2018-11-15 LAB — ECHOCARDIOGRAM COMPLETE
Height: 69 in
Weight: 3012.37 oz

## 2018-11-15 LAB — MAGNESIUM: Magnesium: 2.3 mg/dL (ref 1.7–2.4)

## 2018-11-15 LAB — GLUCOSE, CAPILLARY: Glucose-Capillary: 154 mg/dL — ABNORMAL HIGH (ref 70–99)

## 2018-11-15 LAB — TROPONIN I: Troponin I: 0.12 ng/mL (ref <0.03)

## 2018-11-15 LAB — APTT: aPTT: 28 seconds (ref 24–36)

## 2018-11-15 LAB — SARS CORONAVIRUS 2 BY RT PCR (HOSPITAL ORDER, PERFORMED IN ~~LOC~~ HOSPITAL LAB): SARS Coronavirus 2: NEGATIVE

## 2018-11-15 LAB — BRAIN NATRIURETIC PEPTIDE: B Natriuretic Peptide: 1479.4 pg/mL — ABNORMAL HIGH (ref 0.0–100.0)

## 2018-11-15 MED ORDER — ASPIRIN 81 MG PO CHEW
81.0000 mg | CHEWABLE_TABLET | Freq: Every day | ORAL | Status: DC
  Administered 2018-11-15 – 2018-11-19 (×5): 81 mg via ORAL
  Filled 2018-11-15 (×5): qty 1

## 2018-11-15 MED ORDER — PRAVASTATIN SODIUM 40 MG PO TABS
80.0000 mg | ORAL_TABLET | Freq: Every day | ORAL | Status: DC
  Administered 2018-11-15 – 2018-11-19 (×5): 80 mg via ORAL
  Filled 2018-11-15 (×5): qty 2

## 2018-11-15 MED ORDER — SODIUM CHLORIDE 0.9 % IV SOLN: Freq: Once | INTRAVENOUS | Status: AC

## 2018-11-15 MED ORDER — OMEGA-3-ACID ETHYL ESTERS 1 G PO CAPS
1.0000 g | ORAL_CAPSULE | Freq: Every day | ORAL | Status: DC
  Administered 2018-11-15 – 2018-11-19 (×5): 1 g via ORAL
  Filled 2018-11-15 (×5): qty 1

## 2018-11-15 MED ORDER — ALLOPURINOL 100 MG PO TABS
100.0000 mg | ORAL_TABLET | Freq: Every day | ORAL | Status: DC
  Administered 2018-11-15 – 2018-11-19 (×5): 100 mg via ORAL
  Filled 2018-11-15 (×5): qty 1

## 2018-11-15 MED ORDER — LORATADINE 10 MG PO TABS
10.0000 mg | ORAL_TABLET | Freq: Every day | ORAL | Status: DC
  Administered 2018-11-15 – 2018-11-19 (×5): 10 mg via ORAL
  Filled 2018-11-15 (×5): qty 1

## 2018-11-15 MED ORDER — POTASSIUM CHLORIDE CRYS ER 20 MEQ PO TBCR
40.0000 meq | EXTENDED_RELEASE_TABLET | Freq: Once | ORAL | Status: AC
  Administered 2018-11-15: 40 meq via ORAL
  Filled 2018-11-15: qty 2

## 2018-11-15 MED ORDER — PANTOPRAZOLE SODIUM 40 MG PO TBEC
40.0000 mg | DELAYED_RELEASE_TABLET | Freq: Every day | ORAL | Status: DC
  Administered 2018-11-15 – 2018-11-19 (×5): 40 mg via ORAL
  Filled 2018-11-15 (×5): qty 1

## 2018-11-15 MED ORDER — NOREPINEPHRINE 4 MG/250ML-% IV SOLN
2.0000 ug/min | INTRAVENOUS | Status: DC
  Administered 2018-11-15: 12 ug/min via INTRAVENOUS
  Administered 2018-11-15: 2 ug/min via INTRAVENOUS
  Administered 2018-11-16: 7 ug/min via INTRAVENOUS
  Administered 2018-11-16: 16 ug/min via INTRAVENOUS
  Administered 2018-11-17: 13 ug/min via INTRAVENOUS
  Administered 2018-11-17: 11 ug/min via INTRAVENOUS
  Administered 2018-11-17: 15 ug/min via INTRAVENOUS
  Filled 2018-11-15 (×10): qty 250

## 2018-11-15 NOTE — Plan of Care (Signed)
  Problem: Education: Goal: Ability to demonstrate management of disease process will improve Outcome: Progressing Goal: Ability to verbalize understanding of medication therapies will improve Outcome: Progressing   Problem: Activity: Goal: Capacity to carry out activities will improve Outcome: Progressing   Problem: Cardiac: Goal: Ability to achieve and maintain adequate cardiopulmonary perfusion will improve Outcome: Progressing   

## 2018-11-15 NOTE — Progress Notes (Signed)
Dr. Georgette Shell aware of low B.P. at lying down. She is on her way up to see him

## 2018-11-15 NOTE — Progress Notes (Signed)
Mindy R.N. Rapid response nurse here and will transport patient to 2 H 16

## 2018-11-15 NOTE — Progress Notes (Addendum)
Dr. Georgette Shell call back and wants B.P. checked lying down.

## 2018-11-15 NOTE — Progress Notes (Signed)
Text paged Dr. Georgette Shell about B.P. in right artm 77/60 .Left arm 74/54. p-80-90. MAP 66

## 2018-11-15 NOTE — Progress Notes (Signed)
Dr. Georgette Shell on floor and in room to see patient. She is aware of Vital signs. Patient voicing no complaints skin warm and try. R.N. aware.

## 2018-11-15 NOTE — Progress Notes (Signed)
Progress Note  Patient Name: Adam Lewis Date of Encounter: 11/15/2018  Primary Cardiologist:   Rozann Lesches, MD   Subjective   Hypotensive last night and transferred to [facility].  Coughs lying flat.    Inpatient Medications    Scheduled Meds: . allopurinol  100 mg Oral Daily  . aspirin  81 mg Oral Daily  . heparin  5,000 Units Subcutaneous Q8H  . omega-3 acid ethyl esters  1 g Oral Daily  . pantoprazole  40 mg Oral Daily  . pravastatin  80 mg Oral Daily  . sodium chloride flush  3 mL Intravenous Q12H   Continuous Infusions: . sodium chloride    . sodium chloride Stopped (11/15/18 0506)  . norepinephrine (LEVOPHED) Adult infusion 5 mcg/min (11/15/18 0800)   PRN Meds: sodium chloride, acetaminophen, ondansetron (ZOFRAN) IV, sodium chloride flush   Vital Signs    Vitals:   11/15/18 0700 11/15/18 0715 11/15/18 0730 11/15/18 0800  BP: (!) 84/59  (!) 80/69 (!) 72/49  Pulse: 91 (!) 104 99 95  Resp: 19 (!) 25 (!) 25 (!) 23  Temp:    98.5 F (36.9 C)  TempSrc:    Oral  SpO2: 93% (!) 88% 93% 95%  Weight:      Height:        Intake/Output Summary (Last 24 hours) at 11/15/2018 0841 Last data filed at 11/15/2018 0800 Gross per 24 hour  Intake 280.46 ml  Output 300 ml  Net -19.54 ml   Filed Weights   11/14/18 1746 11/14/18 2347 11/15/18 0424  Weight: 83 kg 85 kg 85.4 kg    Telemetry    NSR - Personally Reviewed  ECG    NA - Personally Reviewed  Physical Exam   GEN: No acute distress.   Neck: No  JVD Cardiac: RRR, no murmurs, rubs, or gallops.  Respiratory: Clear  to auscultation bilaterally. GI: Soft, nontender, non-distended  MS: Mild ankle edema; No deformity. Neuro:  Nonfocal  Psych: Normal affect   Labs    Chemistry Recent Labs  Lab 11/12/18 1158 11/14/18 1755 11/15/18 0022  NA 135 136 136  K 3.3* 3.8 3.4*  CL 94* 91* 91*  CO2 32 30 30  GLUCOSE 125* 112* 162*  BUN 52* 55* 54*  CREATININE 1.86* 1.91* 1.96*  CALCIUM 9.2 9.5 9.4   GFRNONAA 35* 34* 33*  GFRAA 41* 40* 38*  ANIONGAP 9 15 15      Hematology Recent Labs  Lab 11/12/18 1158 11/14/18 1755  WBC 8.2 7.5  RBC 3.86* 3.80*  HGB 12.6* 12.4*  HCT 37.2* 36.6*  MCV 96.4 96.3  MCH 32.6 32.6  MCHC 33.9 33.9  RDW 12.5 12.5  PLT 211 229    Cardiac Enzymes Recent Labs  Lab 11/15/18 0022  TROPONINI 0.12*   No results for input(s): TROPIPOC in the last 168 hours.   BNP Recent Labs  Lab 11/12/18 1158 11/15/18 0022  BNP 1,422.7* 1,479.4*     DDimer No results for input(s): DDIMER in the last 168 hours.   Radiology    Dg Chest 2 View  Result Date: 11/14/2018 CLINICAL DATA:  Hypotension EXAM: CHEST - 2 VIEW COMPARISON:  Chest x-ray dated 07/30/2018 FINDINGS: The cardiac silhouette is mildly enlarged. There are new small bilateral pleural effusions, right worse than left. There are bibasilar airspace opacities favored to represent compressive atelectasis. There is generalized volume overload. No pneumothorax. The pulmonary arteries appears somewhat dilated which can be seen in patients with elevated PA  pressures. There is no acute osseous abnormality. IMPRESSION: 1. Small bilateral pleural effusions, right worse than left. These have increased significantly since prior chest x-ray. 2. Borderline enlarged heart.  Generalized volume overload is noted. 3. Bibasilar airspace opacities favored to represent compressive atelectasis. Electronically Signed   By: Constance Holster M.D.   On: 11/14/2018 18:35    Cardiac Studies   Echo pending.   Patient Profile     73 y.o. male with a history of hypertension and mixed hyperlipidemiainitially referred by Dr. 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 Admitted with difficult to manage volume status, AKI.  Assessment & Plan   ACUTE DIASTOLIC HF:  Frustrating course of progressive decline over a year for this patient.  Not responsive to diuretics.  Biopsy ad [facility].  I reviewed  these records.  Now with clinical overdiuresis.  Holding diuretics.  On Levophed.  Repeat echo pending.  Note at [facility] his wedge was PAP mean was 46.  Wedge 30.  I would suggest repeat right heart cath to see if this has changed.      HYPOTENSION:   Transferred after he was given some volume last night.  Now on low dose norepinephrine.   Lactic acid trended down.    AKI:   Creat is elevated from baseline.     ELEVATED TROPONIN:  Demand ischemia.     For questions or updates, please contact [place] Please consult [email or web address] for contact info under Cardiology/STEMI.   Signed, Minus Breeding, MD  11/15/2018, 8:41 AM

## 2018-11-15 NOTE — Progress Notes (Signed)
Report called to 2 Heart R.N. Patient transported with Rapid Response Rn and Charge R.N. via bed with monitor. With no complaints and with belongings. R.N. @ heart R.N. aware patietn has lasix from home and needs to be sent to Pharm.

## 2018-11-15 NOTE — H&P (Addendum)
History & Physical    Patient ID: Adam Lewis MRN: [id number], DOB/AGE: September 26, 1945   Admit date: 11/14/2018   Primary Physician: Redmond School, MD Primary Cardiologist: Rozann Lesches, MD  Patient Profile    Adam Lewis a 73 y.o.malewith a history of hypertension and mixed hyperlipidemiainitially referred by Dr. 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 Admitted with difficult to manage volume status, AKI.  Past Medical History    Past Medical History:  Diagnosis Date   Essential hypertension    Family history of adverse reaction to anesthesia    father died after OHS in 65, unaware of reason   GERD (gastroesophageal reflux disease)    Hypertriglyceridemia    Osteoarthritis     Past Surgical History:  Procedure Laterality Date   CARPAL TUNNEL RELEASE     COLONOSCOPY  01/02/2011   Procedure: COLONOSCOPY;  Surgeon: Jamesetta So;  Location: [facility];  Service: Gastroenterology;  Laterality: N/A;   LEFT HEART CATH AND CORONARY ANGIOGRAPHY N/A 02/18/2018   Procedure: LEFT HEART CATH AND CORONARY ANGIOGRAPHY;  Surgeon: Troy Sine, MD;  Location: [facility];  Service: Cardiovascular;  Laterality: N/A;   LITHOTRIPSY  05/2009   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     Allergies  Allergies  Allergen Reactions   Bee Venom Hives    History of Present Illness    Adam Lewis a 73 y.o.malewith a history of hypertension and mixed hyperlipidemiainitially referred by Dr. 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  He has called the HF clinic a few times over the last weeks with SOB and edema. Seen in clinic on 5/13 with ReDS reading of 50%. He was instructed to take metolazone x 2 days and daily torsemide was increased to 60 mg daily. He called HF clinic on Monday 5/18 with 4 lb weight gain and was instructed to take additional metolazone x 1  and increase daily torsemide to 40 mg BID. [facility] RN saw him 5/19 and got ReDS reading of 49%. He was also hypoxic to 86-87% with ambulation. Weight had improved to 184 lbs. He was advised to double torsemide and to be seen in HF clinic for repeat ReDS and labs. He returned to clinic on Wednesday 5/20 for follow up.  Overall doing poorly. He went down 1 lb with initial metolazone, but went back up 4-5 lbs. No increase in UOP with ongoing BLE edema and orthopnea. Energy level very poor. He is SOB after walking 15 feet. +dry cough, occasional productive with clear sputum. + dizzy after coughing. Wearing O2 qHS. No fever or chills. No sick contacts. Limits fluid intake. Weight was up 6lbs on clinic scale. ReDS 46% (improved from 50% last week). Discussed admission at that time for IV diuresis but patient preferred to try to remain outpatient. Was given 74m IV lasix in clinic with plan for home health nurse to administer second dose the following day. Unfortunately, home health did not arrive the next day. Was evaluated again via telehealth on 5/22 with plan for home health  Nurse to administer lasix that day but when she arrived, patient was not feeling well and BP 84/50 with hypoxia to 80% on room air. After further discussion, was advised for patient to come to the emergency room for further management.  In the ED, patient with ongoing low BPs in the 840R-73Osystolic, oxygen saturation significantly improved to the 90s on  RA. Labs remarkable for Cr elevated to 1.9, troponin 0.12, lactate elevated at 2.3. Given tenuous volume status, patient admitted for further optimization. Upon my evaluation, patient appears somewhat short of breath and with frequent coughing. He reports that his breathing has been relatively stable but with ongoing LEE. Had been hopeful that outpatient medication adjustments were helping but scale weight today up 4 lbs and with minimal urine output in response to IV lasix. Denies any chest pain,  palpitations, light headedness.   Home Medications    Prior to Admission medications   Medication Sig Start Date End Date Taking? Authorizing Provider  allopurinol (ZYLOPRIM) 100 MG tablet Take 100 mg by mouth daily.  09/30/17  Yes [provider]  fish oil-omega-3 fatty acids 1000 MG capsule Take 1 g by mouth daily.    Yes [provider]  lansoprazole (PREVACID) 30 MG capsule Take 30 mg by mouth daily.     Yes [provider]  losartan (COZAAR) 25 MG tablet Take 1 tablet (25 mg total) by mouth daily. 07/07/18 11/05/19 Yes Satira Sark, MD  metolazone (ZAROXOLYN) 2.5 MG tablet Take 1 tablet (2.5 mg total) by mouth as directed. Patient taking differently: Take 2.5 mg by mouth See admin instructions. Take one tablet (2.5 mg) by mouth when directed to do so by MD 11/05/18 02/03/19 Yes Georgiana Shore, NP  nitroGLYCERIN (NITROSTAT) 0.4 MG SL tablet Place 1 tablet (0.4 mg total) under the tongue every 5 (five) minutes as needed for chest pain. 12/18/17  Yes Strader, [place] M, PA-C  potassium chloride (K-DUR) 10 MEQ tablet Take 20 mEq by mouth 2 (two) times daily.    Yes [provider]  pravastatin (PRAVACHOL) 80 MG tablet Take 80 mg by mouth daily.  09/24/17  Yes [provider]  torsemide (DEMADEX) 20 MG tablet Take 40 mg by mouth 2 (two) times daily.    Yes [provider]  furosemide (LASIX) 10 MG/ML injection Inject 8 mLs (80 mg total) into the vein once for 1 dose. 11/12/18 11/12/18  Georgiana Shore, NP    Family History    Family History  Problem Relation Age of Onset   Hypertension Father    Heart disease Father    He indicated that the status of his father is unknown.   Social History    Social History   Socioeconomic History   Marital status: Married    Spouse name: Not on file   Number of children: Not on file   Years of education: Not on file   Highest education level: Not on file  Occupational History   Not on  file  Social Needs   Financial resource strain: Not on file   Food insecurity:    Worry: Not on file    Inability: Not on file   Transportation needs:    Medical: Not on file    Non-medical: Not on file  Tobacco Use   Smoking status: Former Smoker    Types: Cigarettes    Last attempt to quit: 07/16/1977    Years since quitting: 41.3   Smokeless tobacco: Never Used  Substance and Sexual Activity   Alcohol use: Yes    Alcohol/week: 16.0 standard drinks    Types: 1 Glasses of wine, 14 Cans of beer, 1 Shots of liquor per week   Drug use: No   Sexual activity: Not on file  Lifestyle   Physical activity:    Days per week: Not on file  Minutes per session: Not on file   Stress: Not on file  Relationships   Social connections:    Talks on phone: Not on file    Gets together: Not on file    Attends religious service: Not on file    Active member of club or organization: Not on file    Attends meetings of clubs or organizations: Not on file    Relationship status: Not on file   Intimate partner violence:    Fear of current or ex partner: Not on file    Emotionally abused: Not on file    Physically abused: Not on file    Forced sexual activity: Not on file  Other Topics Concern   Not on file  Social History Narrative   Not on file     Review of Systems    General:  No chills, fever, night sweats or weight changes.  Cardiovascular:  As per HPI Dermatological: No rash, lesions/masses Respiratory: As per HPI Urologic: No hematuria, dysuria Abdominal:   No nausea, vomiting, diarrhea, bright red blood per rectum, melena, or hematemesis Neurologic:  No visual changes, wkns, changes in mental status. All other systems reviewed and are otherwise negative except as noted above.  Physical Exam    Blood pressure (!) 80/58, pulse 100, temperature 98 F (36.7 C), temperature source Oral, resp. rate (!) 24, height '5\' 9"'  (1.753 m), weight 85 kg, SpO2 93 %.  General:  Pleasant, appears dyspneic Psych: Normal affect. Neuro: Alert and oriented X 3. Moves all extremities spontaneously. HEENT: Normal  Neck: Supple without bruits. JVP elevated to mid neck at 60 degrees. Lungs: Bibasilar crackles. No wheezes or rhonchi.  Heart: RRR no s3, s4, or murmurs. Abdomen: Soft, non-tender, non-distended, BS + x 4.  Extremities: No clubbing, cyanosis. Patient with 2+ pitting edema in bilateral lower extremities. Legs are cold to the touch. DP/PT/Radials 2+ and equal bilaterally.  Labs    Troponin (Point of Care Test) No results for input(s): TROPIPOC in the last 72 hours. Recent Labs    11/15/18 0022  TROPONINI 0.12*   Lab Results  Component Value Date   WBC 7.5 11/14/2018   HGB 12.4 (L) 11/14/2018   HCT 36.6 (L) 11/14/2018   MCV 96.3 11/14/2018   PLT 229 11/14/2018    Recent Labs  Lab 11/15/18 0022  NA 136  K 3.4*  CL 91*  CO2 30  BUN 54*  CREATININE 1.96*  CALCIUM 9.4  GLUCOSE 162*   No results found for: CHOL, HDL, LDLCALC, TRIG No results found for: [facility]   Radiology Studies    Dg Chest 2 View  Result Date: 11/14/2018 CLINICAL DATA:  Hypotension EXAM: CHEST - 2 VIEW COMPARISON:  Chest x-ray dated 07/30/2018 FINDINGS: The cardiac silhouette is mildly enlarged. There are new small bilateral pleural effusions, right worse than left. There are bibasilar airspace opacities favored to represent compressive atelectasis. There is generalized volume overload. No pneumothorax. The pulmonary arteries appears somewhat dilated which can be seen in patients with elevated PA pressures. There is no acute osseous abnormality. IMPRESSION: 1. Small bilateral pleural effusions, right worse than left. These have increased significantly since prior chest x-ray. 2. Borderline enlarged heart.  Generalized volume overload is noted. 3. Bibasilar airspace opacities favored to represent compressive atelectasis. Electronically Signed   By: Constance Holster M.D.   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 Bone marrow biopsy is requested  for further specification. EXAM: CT GUIDED BONE MARROW ASPIRATION AND CORE BIOPSY Interventional Radiologist:  Criselda Peaches, MD MEDICATIONS: None. ANESTHESIA/SEDATION: A total of 1 mg Versed was administered for anxiety lysis. Hypertension prevented additional sedation. FLUOROSCOPY TIME:  None. COMPLICATIONS: None immediate. Estimated blood loss: <25 mL PROCEDURE: Informed written consent was obtained from the patient after a thorough discussion of the procedural risks, benefits and alternatives. All questions were addressed. Maximal Sterile Barrier Technique was utilized including caps, mask, sterile gowns, sterile gloves, sterile drape, hand hygiene and skin antiseptic. A timeout was performed prior to the initiation of the procedure. The patient was positioned prone and non-contrast localization CT was performed of the pelvis to demonstrate the iliac marrow spaces. Maximal barrier sterile technique utilized including caps, mask, sterile gowns, sterile gloves, large sterile drape, hand hygiene, and betadine prep. Under sterile conditions and local anesthesia, an 11 gauge coaxial bone biopsy needle was advanced into the left iliac marrow space. Needle position was confirmed with CT imaging. Initially, bone marrow aspiration was performed. Next, the 11 gauge outer cannula was utilized to obtain a left iliac bone marrow core biopsy. Needle was removed. Hemostasis was obtained with compression. The patient tolerated the procedure well. Samples were prepared with the cytotechnologist. IMPRESSION: Technically successful CT-guided bone marrow aspiration and biopsy of the left iliac bone. Electronically Signed   By: Jacqulynn Cadet M.D.   On: 10/24/2018 12:14   Ct Bone Marrow Biopsy & Aspiration  Result Date: 10/24/2018 INDICATION: 73 year old male with amyloid heart  disease. Bone marrow biopsy is requested for further specification. EXAM: CT GUIDED BONE MARROW ASPIRATION AND CORE BIOPSY Interventional Radiologist:  Criselda Peaches, MD MEDICATIONS: None. ANESTHESIA/SEDATION: A total of 1 mg Versed was administered for anxiety lysis. Hypertension prevented additional sedation. FLUOROSCOPY TIME:  None. COMPLICATIONS: None immediate. Estimated blood loss: <25 mL PROCEDURE: Informed written consent was obtained from the patient after a thorough discussion of the procedural risks, benefits and alternatives. All questions were addressed. Maximal Sterile Barrier Technique was utilized including caps, mask, sterile gowns, sterile gloves, sterile drape, hand hygiene and skin antiseptic. A timeout was performed prior to the initiation of the procedure. The patient was positioned prone and non-contrast localization CT was performed of the pelvis to demonstrate the iliac marrow spaces. Maximal barrier sterile technique utilized including caps, mask, sterile gowns, sterile gloves, large sterile drape, hand hygiene, and betadine prep. Under sterile conditions and local anesthesia, an 11 gauge coaxial bone biopsy needle was advanced into the left iliac marrow space. Needle position was confirmed with CT imaging. Initially, bone marrow aspiration was performed. Next, the 11 gauge outer cannula was utilized to obtain a left iliac bone marrow core biopsy. Needle was removed. Hemostasis was obtained with compression. The patient tolerated the procedure well. Samples were prepared with the cytotechnologist. IMPRESSION: Technically successful CT-guided bone marrow aspiration and biopsy of the left iliac bone. Electronically Signed   By: Jacqulynn Cadet M.D.   On: 10/24/2018 12:14    ECG & Cardiac Imaging    ECG 11/15/18 - personally reviewed. Sinus rhythm. Lateral TWI.   - Echo 8/19 EF 55-60% with moderate LVH restrictive physiology. RVSP 80mHG. - Cardiac catheterization 8/19  demonstrated normal coronary arteries.LVEDP 27.  - Chest CT scan 12/19: Moderate layering pleural effusions and minimal pulmonary edema.Negative for pulmonary embolism or sarcoid. - cMRI 08/14/18 1. Septal hypertrophy normal LV size mild diffuse hypokinesis EF 49% 2. Markedly abnormal delayed gadolinium images with severe diffuse myocardial uptake suggesting severe  infiltrative cardiomyopathy. Consider biopsy and genetic testing 3. Mild RV enlargement 4. Small circumferential pericardial effusion 5. Mild bi-atrial enlargement - PYP 3/20: Quantitative H/CL 1.58 but visually equivocal  Assessment & Plan    Adam Lewis a 73 y.o.malewith a history of hypertension and mixed hyperlipidemiainitially referred by Dr. 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 Admitted with difficult to manage volume status, AKI.  # HFpEF # Hypotension Patient with tenuous volume status. Has had decreasing response to escalation of diuretics as outpatient but now also with AKI. Continues to appear mildly volume overloaded on examination. Diuresis also limited by lower than baseline blood pressures. Review of prior clinic notes shows BPs generally in the upper 38B-OFB 510C systolic but now have dipped as low as 58N systolic following admission. Patient reports no symptoms related to low blood pressure. Certainly if patient is intravascularly wet, diuresis would help both blood pressure and renal function but Cr has increased from 1.45-1.96 in past week with increasing diuresis.  - Would likely benefit from [place] to further delineate volume status and cardiac index - Hold on further diuresis currently in the setting of hypotension and AKI, will reevaluate in the AM and likely give at least maintenance dosing  - Trend lactate, mildly elevated at 2.3  # Elevated troponin Likely type II event in the setting of ongoing heart failure. No chest pain or symptoms of ACS and normal  coronary arteries by [facility] in 2019. - Trend troponin, ECGs - Hold on initiation of heparin for now but will initiate if ongoing uptrending troponin - ASA 25m daily  # AKI - Hold home diuretics - Avoid nephrotoxic agents  # HTN - Hold home losartan  # HL - Cont home pravastatin  # FEN/GI - Low sodium diet - Strict I/Os  # FULL CODE  Signed, ABryna Colander MD 11/15/2018, 2:35 AM   Addendum 3:27 AM Following admission, patient with BP down to 627Psystolic. Rebounded to 782Usystolic but did not increase further. Will initiate transfer to CICU and plan to initiate levophed support for MAP>60.

## 2018-11-15 NOTE — Progress Notes (Signed)
Echocardiogram 2D Echocardiogram has been performed.  Matilde Bash 11/15/2018, 9:48 AM

## 2018-11-15 NOTE — Progress Notes (Signed)
N.S. fluid bolus 250 ml over 1 hour started per Dr. Laverta Baltimore orders. Patient does not want wife to be notified til in the morning.

## 2018-11-15 NOTE — Progress Notes (Addendum)
Text page Dr. Georgette Shell to review 00:30 all labs.

## 2018-11-15 NOTE — Progress Notes (Signed)
Assisted moving pt from 4E10 to 2H16.

## 2018-11-16 ENCOUNTER — Inpatient Hospital Stay: Payer: Self-pay

## 2018-11-16 ENCOUNTER — Encounter (HOSPITAL_COMMUNITY): Payer: Self-pay

## 2018-11-16 DIAGNOSIS — E854 Organ-limited amyloidosis: Secondary | ICD-10-CM

## 2018-11-16 DIAGNOSIS — I5033 Acute on chronic diastolic (congestive) heart failure: Secondary | ICD-10-CM

## 2018-11-16 DIAGNOSIS — I43 Cardiomyopathy in diseases classified elsewhere: Secondary | ICD-10-CM

## 2018-11-16 LAB — POCT I-STAT 7, (LYTES, BLD GAS, ICA,H+H)
Acid-Base Excess: 5 mmol/L — ABNORMAL HIGH (ref 0.0–2.0)
Bicarbonate: 30 mmol/L — ABNORMAL HIGH (ref 20.0–28.0)
Calcium, Ion: 1.2 mmol/L (ref 1.15–1.40)
HCT: 36 % — ABNORMAL LOW (ref 39.0–52.0)
Hemoglobin: 12.2 g/dL — ABNORMAL LOW (ref 13.0–17.0)
O2 Saturation: 99 %
Patient temperature: 98.1
Potassium: 4.1 mmol/L (ref 3.5–5.1)
Sodium: 136 mmol/L (ref 135–145)
TCO2: 31 mmol/L (ref 22–32)
pCO2 arterial: 43.9 mmHg (ref 32.0–48.0)
pH, Arterial: 7.441 (ref 7.350–7.450)
pO2, Arterial: 141 mmHg — ABNORMAL HIGH (ref 83.0–108.0)

## 2018-11-16 LAB — COMPREHENSIVE METABOLIC PANEL
ALT: 22 U/L (ref 0–44)
AST: 27 U/L (ref 15–41)
Albumin: 3.5 g/dL (ref 3.5–5.0)
Alkaline Phosphatase: 58 U/L (ref 38–126)
Anion gap: 13 (ref 5–15)
BUN: 51 mg/dL — ABNORMAL HIGH (ref 8–23)
CO2: 30 mmol/L (ref 22–32)
Calcium: 9.4 mg/dL (ref 8.9–10.3)
Chloride: 95 mmol/L — ABNORMAL LOW (ref 98–111)
Creatinine, Ser: 1.51 mg/dL — ABNORMAL HIGH (ref 0.61–1.24)
GFR calc Af Amer: 53 mL/min — ABNORMAL LOW (ref >60)
GFR calc non Af Amer: 45 mL/min — ABNORMAL LOW (ref >60)
Glucose, Bld: 136 mg/dL — ABNORMAL HIGH (ref 70–99)
Potassium: 4 mmol/L (ref 3.5–5.1)
Sodium: 138 mmol/L (ref 135–145)
Total Bilirubin: 1.6 mg/dL — ABNORMAL HIGH (ref 0.3–1.2)
Total Protein: 6.2 g/dL — ABNORMAL LOW (ref 6.5–8.1)

## 2018-11-16 LAB — COOXEMETRY PANEL
Carboxyhemoglobin: 2 % — ABNORMAL HIGH (ref 0.5–1.5)
Carboxyhemoglobin: 1.8 % — ABNORMAL HIGH (ref 0.5–1.5)
Methemoglobin: 1.1 % (ref 0.0–1.5)
Methemoglobin: 1.6 % — ABNORMAL HIGH (ref 0.0–1.5)
O2 Saturation: 48.7 %
O2 Saturation: 64.4 %
Total hemoglobin: 7.7 g/dL — ABNORMAL LOW (ref 12.0–16.0)
Total hemoglobin: 11.6 g/dL — ABNORMAL LOW (ref 12.0–16.0)

## 2018-11-16 MED ORDER — FUROSEMIDE 10 MG/ML IJ SOLN
40.0000 mg | Freq: Once | INTRAMUSCULAR | Status: AC
  Administered 2018-11-16: 40 mg via INTRAVENOUS

## 2018-11-16 MED ORDER — FUROSEMIDE 10 MG/ML IJ SOLN
12.0000 mg/h | INTRAVENOUS | Status: DC
  Administered 2018-11-16 – 2018-11-19 (×3): 12 mg/h via INTRAVENOUS
  Filled 2018-11-16: qty 25
  Filled 2018-11-16: qty 21
  Filled 2018-11-16 (×2): qty 25

## 2018-11-16 MED ORDER — CHLORHEXIDINE GLUCONATE CLOTH 2 % EX PADS
6.0000 | MEDICATED_PAD | Freq: Every day | CUTANEOUS | Status: DC
  Administered 2018-11-16: 6 via TOPICAL

## 2018-11-16 MED ORDER — MILRINONE LACTATE IN DEXTROSE 20-5 MG/100ML-% IV SOLN
0.1250 ug/kg/min | INTRAVENOUS | Status: DC
  Administered 2018-11-16 – 2018-11-18 (×3): 0.125 ug/kg/min via INTRAVENOUS
  Filled 2018-11-16 (×3): qty 100

## 2018-11-16 MED ORDER — FUROSEMIDE 10 MG/ML IJ SOLN
80.0000 mg | Freq: Two times a day (BID) | INTRAMUSCULAR | Status: DC
  Administered 2018-11-16: 80 mg via INTRAVENOUS
  Filled 2018-11-16: qty 8

## 2018-11-16 MED ORDER — DOCUSATE SODIUM 100 MG PO CAPS
100.0000 mg | ORAL_CAPSULE | Freq: Two times a day (BID) | ORAL | Status: DC | PRN
  Administered 2018-11-16: 100 mg via ORAL
  Filled 2018-11-16: qty 1

## 2018-11-16 MED ORDER — POTASSIUM CHLORIDE CRYS ER 20 MEQ PO TBCR
40.0000 meq | EXTENDED_RELEASE_TABLET | Freq: Once | ORAL | Status: AC
  Administered 2018-11-16: 40 meq via ORAL
  Filled 2018-11-16: qty 2

## 2018-11-16 MED ORDER — FUROSEMIDE 10 MG/ML IJ SOLN
INTRAMUSCULAR | Status: AC
  Filled 2018-11-16: qty 4

## 2018-11-16 MED ORDER — SODIUM CHLORIDE 0.9% FLUSH
10.0000 mL | Freq: Two times a day (BID) | INTRAVENOUS | Status: DC
  Administered 2018-11-16 – 2018-11-18 (×3): 10 mL
  Administered 2018-11-18: 20 mL
  Administered 2018-11-19: 10 mL

## 2018-11-16 MED ORDER — MORPHINE SULFATE (PF) 2 MG/ML IV SOLN
1.0000 mg | INTRAVENOUS | Status: DC | PRN
  Administered 2018-11-16 (×2): 1 mg via INTRAVENOUS
  Filled 2018-11-16 (×2): qty 1

## 2018-11-16 MED ORDER — SODIUM CHLORIDE 0.9% FLUSH: 10.0000 mL | INTRAVENOUS | Status: DC | PRN

## 2018-11-16 MED ORDER — ALPRAZOLAM 0.25 MG PO TABS
0.2500 mg | ORAL_TABLET | Freq: Three times a day (TID) | ORAL | Status: DC | PRN
  Administered 2018-11-16 – 2018-11-18 (×3): 0.25 mg via ORAL
  Filled 2018-11-16 (×3): qty 1

## 2018-11-16 NOTE — Progress Notes (Signed)
Peripherally Inserted Central Catheter/Midline Placement  The IV Nurse has discussed with the patient and/or persons authorized to consent for the patient, the purpose of this procedure and the potential benefits and risks involved with this procedure.  The benefits include less needle sticks, lab draws from the catheter, and the patient may be discharged home with the catheter. Risks include, but not limited to, infection, bleeding, blood clot (thrombus formation), and puncture of an artery; nerve damage and irregular heartbeat and possibility to perform a PICC exchange if needed/ordered by physician.  Alternatives to this procedure were also discussed.  Bard Power PICC patient education guide, fact sheet on infection prevention and patient information card has been provided to patient /or left at bedside.    PICC/Midline Placement Documentation  PICC Triple Lumen 69/79/48 PICC Right Basilic 42 cm 0 cm (Active)  Indication for Insertion or Continuance of Line Vasoactive infusions 11/16/2018 11:00 AM  Exposed Catheter (cm) 0 cm 11/16/2018 11:00 AM  Site Assessment Clean;Dry;Intact 11/16/2018 11:00 AM  Lumen #1 Status Flushed;Blood return noted 11/16/2018 11:00 AM  Lumen #2 Status Flushed;Blood return noted 11/16/2018 11:00 AM  Lumen #3 Status Flushed;Blood return noted 11/16/2018 11:00 AM  Dressing Type Transparent 11/16/2018 11:00 AM  Dressing Status Clean;Dry;Intact;Antimicrobial disc in place 11/16/2018 11:00 AM  Dressing Change Due 11/23/18 11/16/2018 11:00 AM       Jule Economy Horton 11/16/2018, 11:34 AM

## 2018-11-16 NOTE — Progress Notes (Addendum)
    S:   CTSP 2/2 ongoing dyspnea and increased WOB.  He received lasix 80mg  IV earlier this AM and has had ~ 600 ml out.  He notes ongoing cough, dyspnea, and orthopnea.  Feels like he is working hard to breathe - sl worse throughout the day today.   O:   Vitals:   11/16/18 1100 11/16/18 1200  BP: 105/78 102/68  Pulse: (!) 107 (!) 109  Resp: (!) 30 (!) 35  Temp:    SpO2: 93% 91%   Pleasant.  Inc wob w/ use of accessory muscles.  Lungs markedly diminished @ bases w/ bibasilar crackles, ~ 1/3 way up.  Cor RRR.  JVP ~ 14. Abd soft. 1+ lee.  2D Echocardiogram 5.23.2020  1. The left ventricle has mildly reduced systolic function, with an ejection fraction of 45-50%. The cavity size was normal. There is moderately increased left ventricular wall thickness. Left ventricular diastolic Doppler parameters are consistent with  restrictive filling.  2. Bright speckled pattern c/w amyloidosis.  3. The right ventricle has normal systolic function. The cavity was normal. There is no increase in right ventricular wall thickness. Right ventricular systolic pressure is severely elevated with an estimated pressure of 68.9 mmHg.  4. Left atrial size was severely dilated.  5. Right atrial size was mildly dilated.  6. Large pleural effusion.  7. Trivial pericardial effusion is present.  8. The aortic valve is abnormal. Mild thickening of the aortic valve. Aortic valve regurgitation is mild by color flow Doppler. Mild aortic annular calcification noted.  9. Tricuspid valve regurgitation is moderate. 10. The aortic root and ascending aorta are normal in size and structure. 11. The inferior vena cava was dilated in size with <50% respiratory variability. 12. The interatrial septum was not assessed.  A/P:  1.  Acute on chronic resp failure/Acute HFmEF/Cardiac amyloidosis: Pt w/ increased wob and dyspnea throughout the day today.  CVP ~ 16-18.  Discussed with Dr. Aundra Dubin, who also saw pt. Will give lasix 40  iv x 1 now and change to gtt @ 12mg /hr.  Checking abg and resp to place bipap.    Murray Hodgkins, NP   Patient seen with NP, agree with the above note.  Co-ox sent by RN, 48.7%.  Given clinical worsening, I will try him on low dose milrinone 0.125 to see if we can get some improvement in RV function/PA pressure.    Loralie Champagne 11/16/2018 1:28 PM

## 2018-11-16 NOTE — Progress Notes (Signed)
Pt set up on salter  10L due to increased WOB.  Pt given lasix by RN and drip will be started.  Pt just finished lunch about 24min. Prior to RT assessing pt.  Bipap on STBY in room, spoke with MD about concerns with placing pt on it since he just ate.  RN aware.  PT tolerating HFNC at this time.  ABG drawn.  RT will continue to monitor.

## 2018-11-16 NOTE — Progress Notes (Signed)
Orthopedic Tech Progress Note Patient Details:  LASH MATULICH [birth year] [id number]  Ortho Devices Type of Ortho Device: Haematologist Ortho Device/Splint Location: bilateral Ortho Device/Splint Interventions: Adjustment, Application, Ordered   Post Interventions Patient Tolerated: Well Instructions Provided: Care of device, Adjustment of device   Janit Pagan 11/16/2018, 10:00 AM

## 2018-11-16 NOTE — Progress Notes (Signed)
Patient ID: Adam Lewis, male   DOB: [birth year], 73 y.o.   MRN: [id number]     Advanced Heart Failure Rounding Note  PCP-Cardiologist: Rozann Lesches, MD   Subjective:    Patient has nagging cough and is short of breath.  He is now on norepinephrine at 8 with stable BP.   Echo: EF 45-50%, moderate LVH with speckled myocardium, normal RV, PASP 69%, IVC dilated.     Objective:   Weight Range: 87 kg Body mass index is 28.32 kg/m.   Vital Signs:   Temp:  [98.4 F (36.9 C)-99.1 F (37.3 C)] 98.9 F (37.2 C) (05/24 0400) Pulse Rate:  [92-115] 96 (05/24 0715) Resp:  [11-36] 23 (05/24 0715) BP: (81-130)/(48-86) 109/81 (05/24 0715) SpO2:  [74 %-100 %] 96 % (05/24 0715) Weight:  [87 kg] 87 kg (05/24 0500) Last BM Date: 11/15/18  Weight change: Filed Weights   11/14/18 2347 11/15/18 0424 11/16/18 0500  Weight: 85 kg 85.4 kg 87 kg    Intake/Output:   Intake/Output Summary (Last 24 hours) at 11/16/2018 0817 Last data filed at 11/16/2018 0700 Gross per 24 hour  Intake 2039.69 ml  Output 750 ml  Net 1289.69 ml      Physical Exam    General:  Well appearing. No resp difficulty HEENT: Normal Neck: Supple. JVP 12-14. Carotids 2+ bilat; no bruits. No lymphadenopathy or thyromegaly appreciated. Cor: PMI nondisplaced. Regular rate & rhythm. No rubs, gallops or murmurs. Lungs: Crackles at bases bilaterally.  Abdomen: Soft, nontender, nondistended. No hepatosplenomegaly. No bruits or masses. Good bowel sounds. Extremities: No cyanosis, clubbing, rash. 1+ edema to knees bilaterally.  Neuro: Alert & orientedx3, cranial nerves grossly intact. moves all 4 extremities w/o difficulty. Affect pleasant   Telemetry   NSR 90s (personally reviewed)  Labs    CBC Recent Labs    11/14/18 1755  WBC 7.5  HGB 12.4*  HCT 36.6*  MCV 96.3  PLT 825   Basic Metabolic Panel Recent Labs    11/15/18 0022 11/16/18 0350  NA 136 138  K 3.4* 4.0  CL 91* 95*  CO2 30 30  GLUCOSE  162* 136*  BUN 54* 51*  CREATININE 1.96* 1.51*  CALCIUM 9.4 9.4  MG 2.3  --    Liver Function Tests Recent Labs    11/16/18 0350  AST 27  ALT 22  ALKPHOS 58  BILITOT 1.6*  PROT 6.2*  ALBUMIN 3.5   No results for input(s): LIPASE, AMYLASE in the last 72 hours. Cardiac Enzymes Recent Labs    11/15/18 0022  TROPONINI 0.12*    BNP: BNP (last 3 results) Recent Labs    11/05/18 1028 11/12/18 1158 11/15/18 0022  BNP 1,481.0* 1,422.7* 1,479.4*    ProBNP (last 3 results) No results for input(s): PROBNP in the last 8760 hours.   D-Dimer No results for input(s): DDIMER in the last 72 hours. Hemoglobin A1C No results for input(s): HGBA1C in the last 72 hours. Fasting Lipid Panel No results for input(s): CHOL, HDL, LDLCALC, TRIG, CHOLHDL, LDLDIRECT in the last 72 hours. Thyroid Function Tests Recent Labs    11/15/18 0022  TSH 3.125    Other results:   Imaging     No results found.   Medications:     Scheduled Medications: . allopurinol  100 mg Oral Daily  . aspirin  81 mg Oral Daily  . Chlorhexidine Gluconate Cloth  6 each Topical Daily  . heparin  5,000 Units Subcutaneous Q8H  . loratadine  10 mg Oral Daily  . omega-3 acid ethyl esters  1 g Oral Daily  . pantoprazole  40 mg Oral Daily  . pravastatin  80 mg Oral Daily  . sodium chloride flush  3 mL Intravenous Q12H     Infusions: . sodium chloride    . norepinephrine (LEVOPHED) Adult infusion 7 mcg/min (11/16/18 0700)     PRN Medications:  sodium chloride, acetaminophen, ondansetron (ZOFRAN) IV, sodium chloride flush   Assessment/Plan   1. Acute on chronic diastolic CHF:  Patient has a restrictive cardiomyopathy, cardiac MRI and echo both look like cardiac amyloidosis.  He has been difficult to diurese with cardiorenal syndrome.  This admission, he developed hypotension with attempted diuresis. He is volume overloaded on exam.  BP now stable on NE and creatinine stable at 1.5.   -  Continue norepinephrine to maintain BP.  Will place PICC for NE, will follow CVP.  - Will try to diurese on NE, will give Lasix 80 mg IV bid.  - Consider RHC but not sure it will change management.  2. Cardiac amyloidosis:  Myocardial biopsy showed amyloidosis but sample too small for mass spectrometry.  He has a monoclonal light chain protein on SPEP and UFE.  Bone marrow biopsy showed no monoclonal B cell population but he had an abnormality on the [facility] analysis.  PYP scan was equivocal for TTR amyloidosis (H/CL 1.58, grade 1 visually).  Based on the rapid progression of his disease, AL amyloidosis would be a strong consideration but workup so far seems ambivalent as to which type of amyloid he has.   - Would have hematology evaluation this week to help [place] interpret the bone marrow biopsy and myeloma panel findings.  3. AKI on CKD: Stage 3, baseline creatinine around 1.5. Creatinine lower this morning with BP maintenance using norepinephrine.   Length of Stay: 2  Loralie Champagne, MD  11/16/2018, 8:17 AM  Advanced Heart Failure Team Pager [phone] (M-F; 7a - 4p)  Please contact [facility] for night-coverage after hours (4p -7a ) and weekends on [email or web address]

## 2018-11-17 ENCOUNTER — Encounter (HOSPITAL_COMMUNITY): Payer: Self-pay | Admitting: *Deleted

## 2018-11-17 DIAGNOSIS — C9000 Multiple myeloma not having achieved remission: Secondary | ICD-10-CM

## 2018-11-17 LAB — BASIC METABOLIC PANEL
Anion gap: 9 (ref 5–15)
Anion gap: 11 (ref 5–15)
BUN: 46 mg/dL — ABNORMAL HIGH (ref 8–23)
BUN: 42 mg/dL — ABNORMAL HIGH (ref 8–23)
CO2: 33 mmol/L — ABNORMAL HIGH (ref 22–32)
CO2: 29 mmol/L (ref 22–32)
Calcium: 8.8 mg/dL — ABNORMAL LOW (ref 8.9–10.3)
Calcium: 8.5 mg/dL — ABNORMAL LOW (ref 8.9–10.3)
Chloride: 96 mmol/L — ABNORMAL LOW (ref 98–111)
Chloride: 94 mmol/L — ABNORMAL LOW (ref 98–111)
Creatinine, Ser: 1.47 mg/dL — ABNORMAL HIGH (ref 0.61–1.24)
Creatinine, Ser: 1.42 mg/dL — ABNORMAL HIGH (ref 0.61–1.24)
GFR calc Af Amer: 54 mL/min — ABNORMAL LOW (ref >60)
GFR calc Af Amer: 57 mL/min — ABNORMAL LOW (ref >60)
GFR calc non Af Amer: 47 mL/min — ABNORMAL LOW (ref >60)
GFR calc non Af Amer: 49 mL/min — ABNORMAL LOW (ref >60)
Glucose, Bld: 149 mg/dL — ABNORMAL HIGH (ref 70–99)
Glucose, Bld: 168 mg/dL — ABNORMAL HIGH (ref 70–99)
Potassium: 4 mmol/L (ref 3.5–5.1)
Potassium: 3 mmol/L — ABNORMAL LOW (ref 3.5–5.1)
Sodium: 138 mmol/L (ref 135–145)
Sodium: 134 mmol/L — ABNORMAL LOW (ref 135–145)

## 2018-11-17 LAB — MAGNESIUM: Magnesium: 2 mg/dL (ref 1.7–2.4)

## 2018-11-17 MED ORDER — CHLORHEXIDINE GLUCONATE CLOTH 2 % EX PADS
6.0000 | MEDICATED_PAD | Freq: Every day | CUTANEOUS | Status: DC
  Administered 2018-11-17 – 2018-11-19 (×2): 6 via TOPICAL

## 2018-11-17 MED ORDER — CHLORHEXIDINE GLUCONATE CLOTH 2 % EX PADS: 6.0000 | MEDICATED_PAD | Freq: Every day | CUTANEOUS | Status: DC

## 2018-11-17 MED ORDER — METOLAZONE 2.5 MG PO TABS
2.5000 mg | ORAL_TABLET | Freq: Once | ORAL | Status: AC
  Administered 2018-11-17: 2.5 mg via ORAL
  Filled 2018-11-17: qty 1

## 2018-11-17 MED ORDER — POTASSIUM CHLORIDE CRYS ER 20 MEQ PO TBCR
40.0000 meq | EXTENDED_RELEASE_TABLET | Freq: Once | ORAL | Status: AC
  Administered 2018-11-17: 40 meq via ORAL
  Filled 2018-11-17: qty 2

## 2018-11-17 NOTE — Progress Notes (Addendum)
Patient ID: Adam Lewis, male   DOB: 05/11/1946, 72 y.o.   MRN: [id number]     Advanced Heart Failure Rounding Note  PCP-Cardiologist: Samuel McDowell, MD   Subjective:    Now on milrinone 0.125 and NE 11.  co-ox 48% -> 64%. On lasix `12/hr. Out 2.5 L but weight up. CVP 12  Feels less dyspneic. No orthopnea or PND.   Echo: EF 45-50%, moderate LVH with speckled myocardium, normal RV, PASP 69%, IVC dilated.     Objective:   Weight Range: 88.3 kg Body mass index is 28.75 kg/m.   Vital Signs:   Temp:  [98.6 F (37 C)-99.9 F (37.7 C)] 98.9 F (37.2 C) (05/25 0809) Pulse Rate:  [104-123] 105 (05/25 0900) Resp:  [15-38] 23 (05/25 0900) BP: (76-125)/(57-87) 115/81 (05/25 0900) SpO2:  [89 %-99 %] 96 % (05/25 0900) Weight:  [88.3 kg] 88.3 kg (05/25 0500) Last BM Date: 11/15/18  Weight change: Filed Weights   11/15/18 0424 11/16/18 0500 11/17/18 0500  Weight: 85.4 kg 87 kg 88.3 kg    Intake/Output:   Intake/Output Summary (Last 24 hours) at 11/17/2018 1039 Last data filed at 11/17/2018 0840 Gross per 24 hour  Intake 2628.8 ml  Output 2570 ml  Net 58.8 ml      Physical Exam    General:  Sitting in chair Weak appearing. No resp difficulty HEENT: normal Neck: supple. JVP to jaw  Carotids 2+ bilat; no bruits. No lymphadenopathy or thryomegaly appreciated. Cor: PMI nondisplaced. Regular rate & rhythm. No rubs, gallops or murmurs. Lungs: clear Abdomen: soft, nontender, nondistended. No hepatosplenomegaly. No bruits or masses. Good bowel sounds. Extremities: no cyanosis, clubbing, rash, 2+ edema + UNNA boots Neuro: alert & orientedx3, cranial nerves grossly intact. moves all 4 extremities w/o difficulty. Affect pleasant   Telemetry   NSR 90-105 (personally reviewed)  Labs    CBC Recent Labs    11/14/18 1755 11/16/18 1328  WBC 7.5  --   HGB 12.4* 12.2*  HCT 36.6* 36.0*  MCV 96.3  --   PLT 229  --    Basic Metabolic Panel Recent Labs    11/15/18  0022 11/16/18 0350 11/16/18 1328 11/17/18 0324  NA 136 138 136 138  K 3.4* 4.0 4.1 4.0  CL 91* 95*  --  96*  CO2 30 30  --  33*  GLUCOSE 162* 136*  --  149*  BUN 54* 51*  --  46*  CREATININE 1.96* 1.51*  --  1.47*  CALCIUM 9.4 9.4  --  8.8*  MG 2.3  --   --   --    Liver Function Tests Recent Labs    11/16/18 0350  AST 27  ALT 22  ALKPHOS 58  BILITOT 1.6*  PROT 6.2*  ALBUMIN 3.5   No results for input(s): LIPASE, AMYLASE in the last 72 hours. Cardiac Enzymes Recent Labs    11/15/18 0022  TROPONINI 0.12*    BNP: BNP (last 3 results) Recent Labs    11/05/18 1028 11/12/18 1158 11/15/18 0022  BNP 1,481.0* 1,422.7* 1,479.4*    ProBNP (last 3 results) No results for input(s): PROBNP in the last 8760 hours.   D-Dimer No results for input(s): DDIMER in the last 72 hours. Hemoglobin A1C No results for input(s): HGBA1C in the last 72 hours. Fasting Lipid Panel No results for input(s): CHOL, HDL, LDLCALC, TRIG, CHOLHDL, LDLDIRECT in the last 72 hours. Thyroid Function Tests Recent Labs    11/15/18 0022  TSH   3.125    Other results:   Imaging    No results found.   Medications:     Scheduled Medications: . allopurinol  100 mg Oral Daily  . aspirin  81 mg Oral Daily  . Chlorhexidine Gluconate Cloth  6 each Topical Daily  . heparin  5,000 Units Subcutaneous Q8H  . loratadine  10 mg Oral Daily  . omega-3 acid ethyl esters  1 g Oral Daily  . pantoprazole  40 mg Oral Daily  . pravastatin  80 mg Oral Daily  . sodium chloride flush  10-40 mL Intracatheter Q12H  . sodium chloride flush  3 mL Intravenous Q12H    Infusions: . sodium chloride    . furosemide (LASIX) infusion 12 mg/hr (11/17/18 0800)  . milrinone 0.125 mcg/kg/min (11/17/18 0800)  . norepinephrine (LEVOPHED) Adult infusion 11 mcg/min (11/17/18 0840)    PRN Medications: sodium chloride, acetaminophen, ALPRAZolam, docusate sodium, morphine injection, ondansetron (ZOFRAN) IV, sodium  chloride flush, sodium chloride flush   Assessment/Plan   1. Acute on chronic diastolic CHF -> cardiogenic shock dut to restrictive CM from AL amyloid:  Patient has a restrictive cardiomyopathy, cardiac MRI and echo both look like cardiac amyloidosis.  He has been difficult to diurese with cardiorenal syndrome.  This admission, he developed hypotension with attempted diuresis.  - EF 45-50%. - cMRI with diffuse infiltrative process.  - Co-ox 48.7 -> 64.4%, Now on NE at 11 and milrinone 0.125  - Urine output picking up with inotropes and lasix gtt  2. Cardiac AL amyloidosis:  Myocardial biopsy showed amyloidosis but sample too small for mass spectrometry.  He has a monoclonal light chain protein on SPEP and UFE.  Bone marrow biopsy showed no monoclonal B cell population on flow cytometry but immunochemistry positive for multiple myeloma with plasma cells 17% by aspirate, 30-40% CD138 PYP scan was equivocal for TTR amyloidosis (H/CL 1.58, grade 1 visually).  - I spoke with Dr. Kale from hematology who will evaluate him and see if he is candidate for therapy. Appreciate his assistance greatly.  - given advanced HF requiring inotropic support, suspect he will be marginal candidate for therapy  3. AKI on CKD: - Stage 3, baseline creatinine around 1.5. Peaked at 1.9 - Likely due to cardiorenal. Improved with inotrope support - Will check UA to look for protein   I had long talk with him about the diagnosis of multiple myeloma and related severe cardiac amyloidosis and the prognosis associated with it once significant HF is present (50% mortality at 6 months). He was understandably frustrated and worried about his family. I offered to call his wife and children but he says he would like to tell them personally. He will speak with Dr. Kale soon.  Will continue Iv diuresis and inotropic support. Wil give one dose metolazone today.   CRITICAL CARE Performed by: Bensimhon, Daniel  Total critical care  time: 35 minutes  Critical care time was exclusive of separately billable procedures and treating other patients.  Critical care was necessary to treat or prevent imminent or life-threatening deterioration.  Critical care was time spent personally by me (independent of midlevel providers or residents) on the following activities: development of treatment plan with patient and/or surrogate as well as nursing, discussions with consultants, evaluation of patient's response to treatment, examination of patient, obtaining history from patient or surrogate, ordering and performing treatments and interventions, ordering and review of laboratory studies, ordering and review of radiographic studies, pulse oximetry and re-evaluation of patient's   condition.    Length of Stay: 3  Daniel Bensimhon, MD  11/17/2018, 10:39 AM  Advanced Heart Failure Team Pager [phone] (M-F; 7a - 4p)  Please contact [facility] for night-coverage after hours (4p -7a ) and weekends on [email or web address]  

## 2018-11-18 ENCOUNTER — Other Ambulatory Visit: Payer: Self-pay | Admitting: Hematology

## 2018-11-18 DIAGNOSIS — Z7189 Other specified counseling: Secondary | ICD-10-CM | POA: Insufficient documentation

## 2018-11-18 DIAGNOSIS — I5023 Acute on chronic systolic (congestive) heart failure: Secondary | ICD-10-CM

## 2018-11-18 DIAGNOSIS — C9000 Multiple myeloma not having achieved remission: Secondary | ICD-10-CM

## 2018-11-18 DIAGNOSIS — I43 Cardiomyopathy in diseases classified elsewhere: Secondary | ICD-10-CM

## 2018-11-18 DIAGNOSIS — E854 Organ-limited amyloidosis: Secondary | ICD-10-CM

## 2018-11-18 LAB — URINALYSIS, ROUTINE W REFLEX MICROSCOPIC
Bacteria, UA: NONE SEEN
Bilirubin Urine: NEGATIVE
Glucose, UA: NEGATIVE mg/dL
Ketones, ur: NEGATIVE mg/dL
Leukocytes,Ua: NEGATIVE
Nitrite: NEGATIVE
Protein, ur: NEGATIVE mg/dL
Specific Gravity, Urine: 1.01 (ref 1.005–1.030)
pH: 6 (ref 5.0–8.0)

## 2018-11-18 LAB — BASIC METABOLIC PANEL
Anion gap: 12 (ref 5–15)
BUN: 42 mg/dL — ABNORMAL HIGH (ref 8–23)
CO2: 31 mmol/L (ref 22–32)
Calcium: 8.6 mg/dL — ABNORMAL LOW (ref 8.9–10.3)
Chloride: 92 mmol/L — ABNORMAL LOW (ref 98–111)
Creatinine, Ser: 1.34 mg/dL — ABNORMAL HIGH (ref 0.61–1.24)
GFR calc Af Amer: >60 mL/min (ref >60)
GFR calc non Af Amer: 53 mL/min — ABNORMAL LOW (ref >60)
Glucose, Bld: 188 mg/dL — ABNORMAL HIGH (ref 70–99)
Potassium: 3 mmol/L — ABNORMAL LOW (ref 3.5–5.1)
Sodium: 135 mmol/L (ref 135–145)

## 2018-11-18 LAB — COOXEMETRY PANEL
Carboxyhemoglobin: 1.8 % — ABNORMAL HIGH (ref 0.5–1.5)
Methemoglobin: 1 % (ref 0.0–1.5)
O2 Saturation: 44.9 %
Total hemoglobin: 11.8 g/dL — ABNORMAL LOW (ref 12.0–16.0)

## 2018-11-18 MED ORDER — POTASSIUM CHLORIDE CRYS ER 20 MEQ PO TBCR
40.0000 meq | EXTENDED_RELEASE_TABLET | Freq: Two times a day (BID) | ORAL | Status: AC
  Administered 2018-11-18 (×2): 40 meq via ORAL
  Filled 2018-11-18 (×2): qty 2

## 2018-11-18 MED ORDER — METOLAZONE 2.5 MG PO TABS
2.5000 mg | ORAL_TABLET | Freq: Once | ORAL | Status: AC
  Administered 2018-11-18: 2.5 mg via ORAL
  Filled 2018-11-18: qty 1

## 2018-11-18 MED ORDER — POTASSIUM CHLORIDE CRYS ER 20 MEQ PO TBCR
40.0000 meq | EXTENDED_RELEASE_TABLET | Freq: Once | ORAL | Status: AC
  Administered 2018-11-18: 40 meq via ORAL
  Filled 2018-11-18: qty 2

## 2018-11-18 NOTE — Consult Note (Addendum)
Marland Kitchen    HEMATOLOGY/ONCOLOGY CONSULTATION NOTE  Date of Service: 11/18/2018  Patient Care Team: Redmond School, MD as PCP - General (Internal Medicine) Satira Sark, MD as PCP - Cardiology (Cardiology) Bensimhon, Shaune Pascal, MD as PCP - Advanced Heart Failure (Cardiology)  CHIEF COMPLAINTS/PURPOSE OF CONSULTATION:  Plasma cell dyscrasia  HISTORY OF PRESENTING ILLNESS:   Adam Lewis is a wonderful 73 y.o. 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 He is currently admitted with decompensated heart failure.  Patient has a history of hypertension, chronic diastolic heart failure with restrictive cardiomyopathy with dyspnea on exertion, GERD, dyslipidemia and osteoarthritis who reports progressively worsening dyspnea on exertion and shortness of breath over the last 6 to 8 months. Patient has been following up with cardiology. Echocardiogram done on 01/2018 showed EF 55-60% with moderate LVH restrictive physiology, RVSP 38 mmHg.  He subsequently had cardiac catheterization in August 2019 which showed normal coronaries. Cardiac MRI done on 08/14/2018 showed1. Septal hypertrophy normal LV size mild diffuse hypokinesis EF 49% 2. Markedly abnormal delayed gadolinium images with severe diffuse myocardial uptake suggesting severe infiltrative cardiomyopathy. Consider biopsy and genetic testing  Patient was subsequently referred to [facility] and had an endomyocardial biopsy on 09/04/2018 which showed  Endomyocardial biopsy; diagnostic biopsy:  Cardiac amyloidosis, mild infiltrative and nodular pattern - See comment Mild myocyte hypertrophy with mild to moderate lipofuscinosis No evidence of myocarditis or granulomatous inflammation Comment:  The biopsy tissue demonstrates several foci of myocyte dropout with replacement by a pale waxy material that suggests amyloid but has some  atypical staining. This likely reflects preservation/processing issue.  Deeper sections no longer contain these foci, but some additional areas with similar interstitial material  are present.  The section for Congo Red stain does not contain any of these foci; however, there is at least on focus of slight interstitial expansion by Congophilic material that exhibits typical birefringence under polarization.  Electron microscopic confirmation will be attempted and expedited. Additionally, the remaining tissue block will be sent for amyloid mass spectrometry. The case has been reviewed with Dr. Tonye Royalty, also of cardiovascular pathology, who is in agreement with the diagnosis.  Apparently cardiac endomyocardial biopsy sample was inadequate for more spectrometric analysis to determine the type of amyloid.  Lab testing on 09/25/2018 for serum free light chains showed significantly elevated lambda free light chains at 2293.1 with normal kappa free light chains with significantly normal ratio. Urine random immunofixation electrophoresis showed Bence-Jones protein lambda type.  Patient subsequently had a CT-guided bone marrow biopsy on 10/24/2018 which showed evidence of plasma cell myeloma with lambda restricted plasma cells at 17% by aspirate and 30 to 40% based on CD 138 immunohistochemistry. Congo red was negative for amyloid on the bone marrow biopsy.  Patient is currently admitted decompensation of his restrictive cardiomyopathy due to amyloidosis with difficult to manage volume status and acute kidney injury. He is currently on a Levophed and Lasix drip with good diuresis and notes that his shortness of breath is improved.  He notes that until summer of last year he was very active and walked his pet regularly several times a day.  Since fall of last year he has noted worsening shortness of breath and dyspnea on exertion. Which is progressively gotten worse.  He just had a discussion with Dr.  Haroldine Laws regarding the concerning prognosis from a cardiology standpoint.  He is concerned about  what this means for his wife and family.  We spent a significant bit of time talking about his diagnosis of plasma cell dyscrasia with AL amyloidosis and additional diagnostics including a 24-hour urine, labs, outpatient PET CT scan to rule out bone lesions and natural history and treatment approaches.  He realizes in the current focus of treatment is to get his heart failure under better control.   MEDICAL HISTORY:  Past Medical History:  Diagnosis Date   Essential hypertension    Family history of adverse reaction to anesthesia    father died after OHS in 27, unaware of reason   GERD (gastroesophageal reflux disease)    Hypertriglyceridemia    Osteoarthritis     SURGICAL HISTORY: Past Surgical History:  Procedure Laterality Date   CARPAL TUNNEL RELEASE     COLONOSCOPY  01/02/2011   Procedure: COLONOSCOPY;  Surgeon: Jamesetta So;  Location: [facility];  Service: Gastroenterology;  Laterality: N/A;   LEFT HEART CATH AND CORONARY ANGIOGRAPHY N/A 02/18/2018   Procedure: LEFT HEART CATH AND CORONARY ANGIOGRAPHY;  Surgeon: Troy Sine, MD;  Location: [facility];  Service: Cardiovascular;  Laterality: N/A;   LITHOTRIPSY  05/2009   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    SOCIAL HISTORY: Social History   Socioeconomic History   Marital status: Married    Spouse name: Not on file   Number of children: Not on file   Years of education: Not on file   Highest education level: Not on file  Occupational History   Not on file  Social Needs   Financial resource strain: Not on file   Food insecurity:    Worry: Not on file    Inability: Not on file   Transportation needs:    Medical: Not on file    Non-medical: Not on file  Tobacco Use   Smoking status: Former Smoker    Types: Cigarettes    Last attempt to quit: 07/16/1977    Years  since quitting: 41.3   Smokeless tobacco: Never Used  Substance and Sexual Activity   Alcohol use: Yes    Alcohol/week: 16.0 standard drinks    Types: 1 Glasses of wine, 14 Cans of beer, 1 Shots of liquor per week   Drug use: No   Sexual activity: Not on file  Lifestyle   Physical activity:    Days per week: Not on file    Minutes per session: Not on file   Stress: Not on file  Relationships   Social connections:    Talks on phone: Not on file    Gets together: Not on file    Attends religious service: Not on file    Active member of club or organization: Not on file    Attends meetings of clubs or organizations: Not on file    Relationship status: Not on file   Intimate partner violence:    Fear of current or ex partner: Not on file    Emotionally abused: Not on file    Physically abused: Not on file    Forced sexual activity: Not on file  Other Topics Concern   Not on file  Social History Narrative   Not on file    FAMILY HISTORY: Family History  Problem Relation Age of Onset   Hypertension Father    Heart disease Father     ALLERGIES:  is allergic to bee venom.  MEDICATIONS:  Current Facility-Administered Medications  Medication Dose Route  Frequency Provider Last Rate Last Dose   0.9 %  sodium chloride infusion  250 mL Intravenous PRN Bryna Colander, MD       acetaminophen (TYLENOL) tablet 650 mg  650 mg Oral Q4H PRN Bryna Colander, MD       allopurinol (ZYLOPRIM) tablet 100 mg  100 mg Oral Daily Bryna Colander, MD   100 mg at 11/17/18 7017   ALPRAZolam Duanne Moron) tablet 0.25 mg  0.25 mg Oral TID PRN Theora Gianotti, NP   0.25 mg at 11/16/18 2250   aspirin chewable tablet 81 mg  81 mg Oral Daily Bryna Colander, MD   81 mg at 11/17/18 0941   Chlorhexidine Gluconate Cloth 2 % PADS 6 each  6 each Topical Daily Dorothy Spark, MD   6 each at 11/17/18 1821   docusate sodium (COLACE) capsule 100 mg  100 mg Oral BID PRN  Theora Gianotti, NP   100 mg at 11/16/18 1052   furosemide (LASIX) 250 mg in dextrose 5 % 250 mL (1 mg/mL) infusion  12 mg/hr Intravenous Continuous Theora Gianotti, NP 12 mL/hr at 11/18/18 0600 12 mg/hr at 11/18/18 0600   heparin injection 5,000 Units  5,000 Units Subcutaneous Q8H Bryna Colander, MD   5,000 Units at 11/18/18 0602   loratadine (CLARITIN) tablet 10 mg  10 mg Oral Daily Minus Breeding, MD   10 mg at 11/17/18 0937   milrinone (PRIMACOR) 20 MG/100 ML (0.2 mg/mL) infusion  0.125 mcg/kg/min Intravenous Continuous Larey Dresser, MD 3.26 mL/hr at 11/18/18 0600 0.125 mcg/kg/min at 11/18/18 0600   morphine 2 MG/ML injection 1 mg  1 mg Intravenous Q4H PRN Theora Gianotti, NP   1 mg at 11/16/18 2022   norepinephrine (LEVOPHED) 77m in 2579mpremix infusion  2-50 mcg/min Intravenous Titrated LoBryna ColanderMD   Stopped at 11/18/18 0054   omega-3 acid ethyl esters (LOVAZA) capsule 1 g  1 g Oral Daily LoBryna ColanderMD   1 g at 11/17/18 0935   ondansetron (ZOFRAN) injection 4 mg  4 mg Intravenous Q6H PRN LoBryna ColanderMD       pantoprazole (PROTONIX) EC tablet 40 mg  40 mg Oral Daily LoBryna ColanderMD   40 mg at 11/17/18 097939 pravastatin (PRAVACHOL) tablet 80 mg  80 mg Oral Daily LoBryna ColanderMD   80 mg at 11/17/18 0936   sodium chloride flush (NS) 0.9 % injection 10-40 mL  10-40 mL Intracatheter Q12H NeDorothy SparkMD   10 mL at 11/17/18 2255   sodium chloride flush (NS) 0.9 % injection 10-40 mL  10-40 mL Intracatheter PRN NeDorothy SparkMD       sodium chloride flush (NS) 0.9 % injection 3 mL  3 mL Intravenous Q12H LoBryna ColanderMD   3 mL at 11/17/18 2254   sodium chloride flush (NS) 0.9 % injection 3 mL  3 mL Intravenous PRN LoBryna ColanderMD        REVIEW OF SYSTEMS:    10 Point review of Systems was done is negative except as noted above.  PHYSICAL EXAMINATION: ECOG PERFORMANCE STATUS: 2  - Symptomatic, <50% confined to bed  . Vitals:   11/18/18 0500 11/18/18 0757  BP: 103/73   Pulse: (!) 106   Resp: (!) 21   Temp:  98.9 F (37.2 C)  SpO2: 93%    Filed Weights   11/15/18 0424 11/16/18 0500 11/17/18 0500  Weight: 188 lb 4.4 oz (85.4 kg)  191 lb 12.8 oz (87 kg) 194 lb 10.7 oz (88.3 kg)   .Body mass index is 28.75 kg/m.  GENERAL:alert, mild emotional distress no respiratory distress SKIN: no acute rashes, no significant lesions EYES: conjunctiva are pink and non-injected, sclera anicteric OROPHARYNX: MMM,  NECK: supple, + JVD LYMPH:  no palpable lymphadenopathy in the cervical, axillary or inguinal regions LUNGS: Bilateral basal rales noted HEART: regular rate & rhythm ABDOMEN:  normoactive bowel sounds , non tender, not distended. Extremity: 2+ pedal edema PSYCH: alert & oriented x 3 with fluent speech NEURO: no focal motor/sensory deficits  LABORATORY DATA:  I have reviewed the data as listed  . CBC Latest Ref Rng & Units 11/16/2018 11/14/2018 11/12/2018  WBC 4.0 - 10.5 K/uL - 7.5 8.2  Hemoglobin 13.0 - 17.0 g/dL 12.2(L) 12.4(L) 12.6(L)  Hematocrit 39.0 - 52.0 % 36.0(L) 36.6(L) 37.2(L)  Platelets 150 - 400 K/uL - 229 211    . CMP Latest Ref Rng & Units 11/18/2018 11/17/2018 11/17/2018  Glucose 70 - 99 mg/dL 188(H) 168(H) 149(H)  BUN 8 - 23 mg/dL 42(H) 42(H) 46(H)  Creatinine 0.61 - 1.24 mg/dL 1.34(H) 1.42(H) 1.47(H)  Sodium 135 - 145 mmol/L 135 134(L) 138  Potassium 3.5 - 5.1 mmol/L 3.0(L) 3.0(L) 4.0  Chloride 98 - 111 mmol/L 92(L) 94(L) 96(L)  CO2 22 - 32 mmol/L 31 29 33(H)  Calcium 8.9 - 10.3 mg/dL 8.6(L) 8.5(L) 8.8(L)  Total Protein 6.5 - 8.1 g/dL - - -  Total Bilirubin 0.3 - 1.2 mg/dL - - -  Alkaline Phos 38 - 126 U/L - - -  AST 15 - 41 U/L - - -  ALT 0 - 44 U/L - - -   Component     Latest Ref Rng & Units 07/22/2018 09/25/2018  IgG (Immunoglobin G), Serum     700 - 1,600 mg/dL 727   IgA     61 - 437 mg/dL 52 (L)   IgM (Immunoglobulin M),  Srm     15 - 143 mg/dL 18   Total Protein ELP     6.0 - 8.5 g/dL 6.0   Albumin SerPl Elph-Mcnc     2.9 - 4.4 g/dL 3.5   Alpha 1     0.0 - 0.4 g/dL 0.3   Alpha2 Glob SerPl Elph-Mcnc     0.4 - 1.0 g/dL 0.7   B-Globulin SerPl Elph-Mcnc     0.7 - 1.3 g/dL 0.9   Gamma Glob SerPl Elph-Mcnc     0.4 - 1.8 g/dL 0.6   M Protein SerPl Elph-Mcnc     Not Observed g/dL Not Observed   Globulin, Total     2.2 - 3.9 g/dL 2.5   Albumin/Glob SerPl     0.7 - 1.7 1.5   IFE 1      Comment   Please Note (HCV):      Comment   Total Protein, Urine-UPE24     Not Estab. mg/dL  289.6  ALBUMIN, U     %  9.9  ALPHA 1 URINE     %  0.4  Alpha 2, Urine     %  4.3  % BETA, Urine     %  4.7  GAMMA GLOBULIN URINE     %  80.6  M-SPIKE %, Urine     Not Observed %  78.5 (H)  Immunofixation Result, Urine       Comment  NOTE:       Comment  Kappa free light  chain     3.3 - 19.4 mg/L  14.2  Lamda free light chains     5.7 - 26.3 mg/L  2,293.1 (H)  Kappa, lamda light chain ratio     0.26 - 1.65  0.01 (L)   Component     Latest Ref Rng & Units 11/15/2018  B Natriuretic Peptide     0.0 - 100.0 pg/mL 1,479.4 (H)  Troponin I     <0.03 ng/mL 0.12 (HH)        RADIOGRAPHIC STUDIES: I have personally reviewed the radiological images as listed and agreed with the findings in the report. Dg Chest 2 View  Result Date: 11/14/2018 CLINICAL DATA:  Hypotension EXAM: CHEST - 2 VIEW COMPARISON:  Chest x-ray dated 07/30/2018 FINDINGS: The cardiac silhouette is mildly enlarged. There are new small bilateral pleural effusions, right worse than left. There are bibasilar airspace opacities favored to represent compressive atelectasis. There is generalized volume overload. No pneumothorax. The pulmonary arteries appears somewhat dilated which can be seen in patients with elevated PA pressures. There is no acute osseous abnormality. IMPRESSION: 1. Small bilateral pleural effusions, right worse than left. These have  increased significantly since prior chest x-ray. 2. Borderline enlarged heart.  Generalized volume overload is noted. 3. Bibasilar airspace opacities favored to represent compressive atelectasis. Electronically Signed   By: Constance Holster M.D.   On: 11/14/2018 18:35   Ct Biopsy  Result Date: 10/24/2018 INDICATION: 73 year old male with amyloid heart disease. Bone marrow biopsy is requested for further specification. EXAM: CT GUIDED BONE MARROW ASPIRATION AND CORE BIOPSY Interventional Radiologist:  Criselda Peaches, MD MEDICATIONS: None. ANESTHESIA/SEDATION: A total of 1 mg Versed was administered for anxiety lysis. Hypertension prevented additional sedation. FLUOROSCOPY TIME:  None. COMPLICATIONS: None immediate. Estimated blood loss: <25 mL PROCEDURE: Informed written consent was obtained from the patient after a thorough discussion of the procedural risks, benefits and alternatives. All questions were addressed. Maximal Sterile Barrier Technique was utilized including caps, mask, sterile gowns, sterile gloves, sterile drape, hand hygiene and skin antiseptic. A timeout was performed prior to the initiation of the procedure. The patient was positioned prone and non-contrast localization CT was performed of the pelvis to demonstrate the iliac marrow spaces. Maximal barrier sterile technique utilized including caps, mask, sterile gowns, sterile gloves, large sterile drape, hand hygiene, and betadine prep. Under sterile conditions and local anesthesia, an 11 gauge coaxial bone biopsy needle was advanced into the left iliac marrow space. Needle position was confirmed with CT imaging. Initially, bone marrow aspiration was performed. Next, the 11 gauge outer cannula was utilized to obtain a left iliac bone marrow core biopsy. Needle was removed. Hemostasis was obtained with compression. The patient tolerated the procedure well. Samples were prepared with the cytotechnologist. IMPRESSION: Technically successful  CT-guided bone marrow aspiration and biopsy of the left iliac bone. Electronically Signed   By: Jacqulynn Cadet M.D.   On: 10/24/2018 12:14   Ct Bone Marrow Biopsy & Aspiration  Result Date: 10/24/2018 INDICATION: 73 year old male with amyloid heart disease. Bone marrow biopsy is requested for further specification. EXAM: CT GUIDED BONE MARROW ASPIRATION AND CORE BIOPSY Interventional Radiologist:  Criselda Peaches, MD MEDICATIONS: None. ANESTHESIA/SEDATION: A total of 1 mg Versed was administered for anxiety lysis. Hypertension prevented additional sedation. FLUOROSCOPY TIME:  None. COMPLICATIONS: None immediate. Estimated blood loss: <25 mL PROCEDURE: Informed written consent was obtained from the patient after a thorough discussion of the procedural risks, benefits and alternatives. All questions were addressed.  Maximal Sterile Barrier Technique was utilized including caps, mask, sterile gowns, sterile gloves, sterile drape, hand hygiene and skin antiseptic. A timeout was performed prior to the initiation of the procedure. The patient was positioned prone and non-contrast localization CT was performed of the pelvis to demonstrate the iliac marrow spaces. Maximal barrier sterile technique utilized including caps, mask, sterile gowns, sterile gloves, large sterile drape, hand hygiene, and betadine prep. Under sterile conditions and local anesthesia, an 11 gauge coaxial bone biopsy needle was advanced into the left iliac marrow space. Needle position was confirmed with CT imaging. Initially, bone marrow aspiration was performed. Next, the 11 gauge outer cannula was utilized to obtain a left iliac bone marrow core biopsy. Needle was removed. Hemostasis was obtained with compression. The patient tolerated the procedure well. Samples were prepared with the cytotechnologist. IMPRESSION: Technically successful CT-guided bone marrow aspiration and biopsy of the left iliac bone. Electronically Signed   By: Jacqulynn Cadet M.D.   On: 10/24/2018 12:14   [place] Ekg Site Rite  Result Date: 11/16/2018 If Site Rite image not attached, placement could not be confirmed due to current cardiac rhythm.   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 Mol Cy - t(11;14) -- standard risk cytogenetics No anemia, hypercalcemia. ? Renal involvement ? Bone lesions.  3) AKI -could be from cardiorenal syndrome.  Cannot rule out possible contribution from his light chain multiple myeloma especially since his involved light chain is more than 1000.  4) hypokalemia-we will need aggressive replacement.  Likely related to his diuretics.  PLAN -Optimization of his decompensated restrictive cardiomyopathy.  Patient is currently on pressors and Lasix drip. -Replace potassium to maintain potassium more than 4 and magnesium more than 2.  To reduce risk of cardiac arrhythmias. -I spent a significant period of time discussing his diagnosis, prognosis, natural history and treatment options in detail with the patient and answering his questions. -24-hour UPEP with light chains ordered.  Patient potentially has developed significant proteinuria which might be a marker for AL amyloidosis affecting his kidneys this could make managing third space fluids more difficult if present. -If patient is stable from a  cardiac standpoint would recommend getting a whole-body skeletal survey to rule out bone lesions -alternatively could get an outpatient PET CT scan. -If patient is improving significantly and is likely to leave the hospital soon after his cardiac status is compensated then we shall follow him up closely on discharge to set him up to start treatments. -Would plan to treat him with Cytoxan/Bortezomib and Dexamethasone regimen for his multiple myeloma and AL amyloidosis especially in the setting of renal dysfunction. -Will need to dose reduce dexamethasone to avoid fluid overload. -Will need to start on acyclovir for shingles prophylaxis with bortezomib. -Will need chemotherapy counseling. -We will continue to follow while in the hospital and set up  close outpatient follow-up at the cancer center with me. -Thank you for this interesting consultation I enjoyed meeting this wonderful person. -We appreciate the excellent cardiology care by Dr. Haroldine Laws and the cardiology team and nursing staff. -Case was discussed with Dr. Haroldine Laws.  All of the patients questions were answered with apparent satisfaction. The patient knows to call the clinic with any problems, questions or concerns.  I spent 60 minutes counseling the patient face to face. The total time spent in the appointment was 80 minutes and more than 50% was on counseling and direct patient cares.    Sullivan Lone MD [place] AAHIVMS [facility] Hematology/Oncology Physician [facility]  (Office):       [phone] (Work cell):  [phone] (Fax):           [phone]

## 2018-11-18 NOTE — Progress Notes (Signed)
START ON PATHWAY REGIMEN - Multiple Myeloma and Other Plasma Cell Dyscrasias     A cycle is every 28 days:     Bortezomib      Cyclophosphamide      Dexamethasone   **Always confirm dose/schedule in your pharmacy ordering system**  Patient Characteristics: Newly Diagnosed, Transplant Eligible, Standard Risk R-ISS Staging: II Disease Classification: Newly Diagnosed Is Patient Eligible for Transplant<= Transplant Eligible Risk Status: Standard Risk Intent of Therapy: Non-Curative / Palliative Intent, Discussed with Patient

## 2018-11-18 NOTE — Progress Notes (Signed)
Long conversation w/patient.  He states feelings of being overwhelmed with this new diagnosis of multiple myeloma, still trying to process everything.  Patient states he has not shared this information with his wife and doesn't want it shared with his wife.  He states he wants to be discharged as soon as possible and sit down with her face to face at home to share this information with her.  Patient states he did tell his daughter what's going on and she is en route from [place].  RN offered to request a consult with Palliative care team simply to discuss resources available for patient and someone to talk to about his feelings, concerns and work thru goals of care.  Patient refused stating he wasn't ready for that.  Will continue to  Monitor needs for emotional support.

## 2018-11-18 NOTE — Progress Notes (Signed)
Patient ID: Adam Lewis, male   DOB: 07-Mar-1946, 73 y.o.   MRN: [id number]     Advanced Heart Failure Rounding Note  PCP-Cardiologist: Rozann Lesches, MD   Subjective:    Now on milrinone 0.125. Off NE  No co-ox today.   On lasix 12/hr. Out 3.8 L. No co-x or weight yet. CVP 8   Breathing better. No CP , orthopnea or PND.   Still trying to process his diagnosis.   Echo: EF 45-50%, moderate LVH with speckled myocardium, normal RV, PASP 69%, IVC dilated.     Objective:   Weight Range: 88.3 kg Body mass index is 28.75 kg/m.   Vital Signs:   Temp:  [98.3 F (36.8 C)-99.1 F (37.3 C)] 98.9 F (37.2 C) (05/26 0757) Pulse Rate:  [98-114] 106 (05/26 0500) Resp:  [10-41] 21 (05/26 0500) BP: (92-123)/(63-85) 103/73 (05/26 0500) SpO2:  [88 %-99 %] 93 % (05/26 0815) Last BM Date: 11/15/18  Weight change: Filed Weights   11/15/18 0424 11/16/18 0500 11/17/18 0500  Weight: 85.4 kg 87 kg 88.3 kg    Intake/Output:   Intake/Output Summary (Last 24 hours) at 11/18/2018 0857 Last data filed at 11/18/2018 0600 Gross per 24 hour  Intake 1090.86 ml  Output 3625 ml  Net -2534.14 ml      Physical Exam    General:  Sitting in chair.  No resp difficulty HEENT: normal Neck: supple. JVP 8 Carotids 2+ bilat; no bruits. No lymphadenopathy or thryomegaly appreciated. Cor: PMI nondisplaced. Regular rate & rhythm. No rubs, gallops or murmurs. Lungs: clear Abdomen: soft, nontender, nondistended. No hepatosplenomegaly. No bruits or masses. Good bowel sounds. Extremities: no cyanosis, clubbing, rash, 1+ edema + UNNA Neuro: alert & orientedx3, cranial nerves grossly intact. moves all 4 extremities w/o difficulty. Affect pleasant   Telemetry   NSR 90-105 (personally reviewed)  Labs    CBC Recent Labs    11/16/18 1328  HGB 12.2*  HCT 93.8*   Basic Metabolic Panel Recent Labs    11/17/18 2108 11/18/18 0354  NA 134* 135  K 3.0* 3.0*  CL 94* 92*  CO2 29 31  GLUCOSE  168* 188*  BUN 42* 42*  CREATININE 1.42* 1.34*  CALCIUM 8.5* 8.6*  MG 2.0  --    Liver Function Tests Recent Labs    11/16/18 0350  AST 27  ALT 22  ALKPHOS 58  BILITOT 1.6*  PROT 6.2*  ALBUMIN 3.5   No results for input(s): LIPASE, AMYLASE in the last 72 hours. Cardiac Enzymes No results for input(s): CKTOTAL, CKMB, CKMBINDEX, TROPONINI in the last 72 hours.  BNP: BNP (last 3 results) Recent Labs    11/05/18 1028 11/12/18 1158 11/15/18 0022  BNP 1,481.0* 1,422.7* 1,479.4*    ProBNP (last 3 results) No results for input(s): PROBNP in the last 8760 hours.   D-Dimer No results for input(s): DDIMER in the last 72 hours. Hemoglobin A1C No results for input(s): HGBA1C in the last 72 hours. Fasting Lipid Panel No results for input(s): CHOL, HDL, LDLCALC, TRIG, CHOLHDL, LDLDIRECT in the last 72 hours. Thyroid Function Tests No results for input(s): TSH, T4TOTAL, T3FREE, THYROIDAB in the last 72 hours.  Invalid input(s): FREET3  Other results:   Imaging    No results found.   Medications:     Scheduled Medications: . allopurinol  100 mg Oral Daily  . aspirin  81 mg Oral Daily  . Chlorhexidine Gluconate Cloth  6 each Topical Daily  . heparin  5,000  Units Subcutaneous Q8H  . loratadine  10 mg Oral Daily  . omega-3 acid ethyl esters  1 g Oral Daily  . pantoprazole  40 mg Oral Daily  . pravastatin  80 mg Oral Daily  . sodium chloride flush  10-40 mL Intracatheter Q12H  . sodium chloride flush  3 mL Intravenous Q12H    Infusions: . sodium chloride    . furosemide (LASIX) infusion 12 mg/hr (11/18/18 0600)  . milrinone 0.125 mcg/kg/min (11/18/18 0600)  . norepinephrine (LEVOPHED) Adult infusion Stopped (11/18/18 0054)    PRN Medications: sodium chloride, acetaminophen, ALPRAZolam, docusate sodium, morphine injection, ondansetron (ZOFRAN) IV, sodium chloride flush, sodium chloride flush   Assessment/Plan   1. Acute on chronic diastolic CHF ->  cardiogenic shock dut to restrictive CM from AL amyloid:  Patient has a restrictive cardiomyopathy, cardiac MRI and echo both look like cardiac amyloidosis.  He has been difficult to diurese with cardiorenal syndrome.  This admission, he developed hypotension with attempted diuresis.  - EF 45-50%. - cMRI with diffuse infiltrative process.  - Co-ox 48.7 initially. Remains on milrinone 0.125. Off NE. Co-ox pending.  - Urine output picked up greatly with inotropes and lasix gtt. CVP 8. Will continue diuresis one more day.   2. Cardiac AL amyloidosis:  Myocardial biopsy showed amyloidosis but sample too small for mass spectrometry.  He has a monoclonal light chain protein on SPEP and UFE.  Bone marrow biopsy showed no monoclonal B cell population on flow cytometry but immunochemistry positive for multiple myeloma with plasma cells 17% by aspirate, 30-40% CD138 PYP scan was equivocal for TTR amyloidosis (H/CL 1.58, grade 1 visually).  - I spoke with Dr. Irene Limbo from hematology who evaluated him yesterday. Await next steps. We can support with home inotrope therapy if needed however given advanced cardiac involvement benefits of therapy (and ability to tolerate) may be limited   3. AKI on CKD: - Stage 3, baseline creatinine around 1.5. Peaked at 1.9. Now 1.3. - Likely due to cardiorenal. Improved with inotrope support - Will check UA to look for protein   4. Hypokalemia - will supp  I had long talk with him about the diagnosis of multiple myeloma and related severe cardiac amyloidosis and the prognosis associated with it once significant HF is present (50% mortality at 6 months). He was understandably frustrated and worried about his family. I offered to call his wife and children but he says he would like to tell them personally. He will speak with Dr. Irene Limbo soon.  Will continue Iv diuresis and inotropic support. Wil give one more dose metolazone today.    Can go to SDU. Appreciate Oncology input.    Length of Stay: [place], MD  11/18/2018, 8:57 AM  Advanced Heart Failure Team Pager [phone] (M-F; 7a - 4p)  Please contact [facility] for night-coverage after hours (4p -7a ) and weekends on [email or web address]

## 2018-11-19 ENCOUNTER — Encounter (HOSPITAL_COMMUNITY): Payer: PPO | Admitting: Internal Medicine

## 2018-11-19 LAB — BASIC METABOLIC PANEL
Anion gap: 13 (ref 5–15)
Anion gap: 10 (ref 5–15)
BUN: 41 mg/dL — ABNORMAL HIGH (ref 8–23)
BUN: 43 mg/dL — ABNORMAL HIGH (ref 8–23)
CO2: 31 mmol/L (ref 22–32)
CO2: 34 mmol/L — ABNORMAL HIGH (ref 22–32)
Calcium: 8.6 mg/dL — ABNORMAL LOW (ref 8.9–10.3)
Calcium: 9 mg/dL (ref 8.9–10.3)
Chloride: 89 mmol/L — ABNORMAL LOW (ref 98–111)
Chloride: 89 mmol/L — ABNORMAL LOW (ref 98–111)
Creatinine, Ser: 1.41 mg/dL — ABNORMAL HIGH (ref 0.61–1.24)
Creatinine, Ser: 1.36 mg/dL — ABNORMAL HIGH (ref 0.61–1.24)
GFR calc Af Amer: 57 mL/min — ABNORMAL LOW (ref >60)
GFR calc Af Amer: 60 mL/min — ABNORMAL LOW (ref >60)
GFR calc non Af Amer: 49 mL/min — ABNORMAL LOW (ref >60)
GFR calc non Af Amer: 52 mL/min — ABNORMAL LOW (ref >60)
Glucose, Bld: 194 mg/dL — ABNORMAL HIGH (ref 70–99)
Glucose, Bld: 130 mg/dL — ABNORMAL HIGH (ref 70–99)
Potassium: 2.8 mmol/L — ABNORMAL LOW (ref 3.5–5.1)
Potassium: 3.4 mmol/L — ABNORMAL LOW (ref 3.5–5.1)
Sodium: 133 mmol/L — ABNORMAL LOW (ref 135–145)
Sodium: 133 mmol/L — ABNORMAL LOW (ref 135–145)

## 2018-11-19 LAB — COOXEMETRY PANEL
Carboxyhemoglobin: 1.6 % — ABNORMAL HIGH (ref 0.5–1.5)
Methemoglobin: 0.9 % (ref 0.0–1.5)
O2 Saturation: 55 %
Total hemoglobin: 12.1 g/dL (ref 12.0–16.0)

## 2018-11-19 LAB — MAGNESIUM: Magnesium: 2 mg/dL (ref 1.7–2.4)

## 2018-11-19 LAB — KAPPA/LAMBDA LIGHT CHAINS
Kappa free light chain: 13.2 mg/L (ref 3.3–19.4)
Kappa, lambda light chain ratio: 0 — ABNORMAL LOW (ref 0.26–1.65)
Lambda free light chains: 3084.8 mg/L — ABNORMAL HIGH (ref 5.7–26.3)

## 2018-11-19 MED ORDER — POTASSIUM CHLORIDE CRYS ER 20 MEQ PO TBCR
40.0000 meq | EXTENDED_RELEASE_TABLET | Freq: Once | ORAL | Status: AC
  Administered 2018-11-19: 40 meq via ORAL
  Filled 2018-11-19: qty 2

## 2018-11-19 MED ORDER — POTASSIUM CHLORIDE CRYS ER 20 MEQ PO TBCR
40.0000 meq | EXTENDED_RELEASE_TABLET | ORAL | Status: AC
  Administered 2018-11-19 (×2): 40 meq via ORAL
  Filled 2018-11-19: qty 2

## 2018-11-19 MED ORDER — POTASSIUM CHLORIDE CRYS ER 20 MEQ PO TBCR: 40.0000 meq | EXTENDED_RELEASE_TABLET | Freq: Once | ORAL | Status: DC

## 2018-11-19 MED ORDER — TORSEMIDE 20 MG PO TABS
60.0000 mg | ORAL_TABLET | Freq: Two times a day (BID) | ORAL | Status: DC
  Administered 2018-11-19: 60 mg via ORAL
  Filled 2018-11-19: qty 3

## 2018-11-19 MED ORDER — TORSEMIDE 20 MG PO TABS: 60.0000 mg | ORAL_TABLET | Freq: Two times a day (BID) | ORAL | 6 refills | Status: AC

## 2018-11-19 MED ORDER — ASPIRIN 81 MG PO CHEW: 81.0000 mg | CHEWABLE_TABLET | Freq: Every day | ORAL | 6 refills | Status: AC

## 2018-11-19 MED ORDER — POTASSIUM CHLORIDE CRYS ER 10 MEQ PO TBCR: 40.0000 meq | EXTENDED_RELEASE_TABLET | Freq: Two times a day (BID) | ORAL | 6 refills | Status: DC

## 2018-11-19 NOTE — Progress Notes (Signed)
Received consult to remove PICC: Pt to receive chemo on 5/29. Discussed with Clyda Greener, she check with MD to see if PICC can remain until then. If not, pt will need new IV site (less than 24 hours old) for chemo administration that day.

## 2018-11-19 NOTE — Care Management (Signed)
1255 11-19-18 Patient currently active with [facility] for [facility]. Plan will be to transition home today. [facility] contracting is aware and she can get orders via the [facility]. No further needs from this CM. Thanks Bethena Roys, RN,BSN Case Manager [phone]

## 2018-11-19 NOTE — Progress Notes (Signed)
Discussed plan for chemo administration 5/29 with Lattie Haw, PharmD. IV Team will administer.

## 2018-11-19 NOTE — Discharge Summary (Addendum)
Advanced Heart Failure Team  Discharge Summary   Patient ID: Adam Lewis MRN: [id number], DOB/AGE: 1945/12/09 73 y.o. Admit date: 11/14/2018 D/C date:     11/19/2018   Primary Discharge Diagnoses:  1. Acute on chronic diastolic CHF -> cardiogenic shock dut to restrictive CM from AL amyloid 2. Cardiac AL amyloidosis 3. AKI on CKD 4. Hypokalemia   Hospital Course:   Adam Lewis a 73 y.o.malewith a history of hypertension and mixed hyperlipidemiainitially referred by Dr. 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 He underwent cardiac biopsy and RHC at [facility] which confirmed cardiac amyloid but not lare enough sample for mass spectrometry. Had UPEP and Urine IFE with m-spike and Lambda light chains.  Admitted with increased dyspnea and volume overload. He failed outpatient diuretic escalation. Placed on IV lasix but had poor response. Central access was placed to guide diuresis and check mixed venous saturation. Initial mixed venous saturation was 48%. Due to sluggish response to IV diuretics, milrinone was started. Diuresis was also limited due to hypotension so norepinephrine was added. As he improved norepinephrine and milrinone were weaned off. Over all he diuresed 9 pounds.   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 He will follow up with Dr Irene Limbo with plan for PET scan and to start Cytoxan/Bortezomib and Dexamethasone regimen for his multiple myeloma and AL amyloidosis. Dr Haroldine Laws set up his hematology follow up for next week.   He will continue to be followed closely in the HF clinic and has follow up with Dr Vaughan Browner next week.    1. Acute on chronic diastolic CHF -> cardiogenic shock dut to restrictive CM from AL amyloid:  Patient has a restrictive cardiomyopathy, cardiac MRI and echo both look like cardiac amyloidosis.  He has been difficult to diurese with cardiorenal syndrome.  This admission,  he developed hypotension with attempted diuresis.  - ECHO 11/15/18 EF 45-50 with speckled pattern consistent with amyloidosis.   2. Cardiac AL amyloidosis:  Myocardial biopsy showed amyloidosis but sample too small for mass spectrometry.  He has a monoclonal light chain protein on SPEP and UFE.  Bone marrow biopsy showed no monoclonal B cell population on flow cytometry but immunochemistry positive for multiple myeloma with plasma cells 17% by aspirate, 30-40% CD138 PYP scan was equivocal for TTR amyloidosis (H/CL 1.58, grade 1 visually).  - Referred to hematology, Dr. Irene Limbo. Planning for Cytoxan/Bortezomib and Dexamethasone regimen for his multiple myeloma and AL amyloidosis especially in the setting of renal dysfunction.  3. AKI on CKD: - Stage 3, baseline creatinine around 1.5. Peaked at 1.9.  - Likely due to cardiorenal. Improved with inotrope support  4. Hypokalemia  Discharge Weight : 183 pounds.   Discharge Vitals: Blood pressure 92/69, pulse (!) 105, temperature 98.6 F (37 C), temperature source Oral, resp. rate 20, height 5' 9" (1.753 m), weight 86.4 kg, SpO2 98 %.  Labs: Lab Results  Component Value Date   WBC 7.5 11/14/2018   HGB 12.2 (L) 11/16/2018   HCT 36.0 (L) 11/16/2018   MCV 96.3 11/14/2018   PLT 229 11/14/2018    Recent Labs  Lab 11/16/18 0350  11/19/18 0347  NA 138   < > 133*  K 4.0   < > 2.8*  CL 95*   < > 89*  CO2 30   < > 31  BUN 51*   < > 41*  CREATININE 1.51*   < > 1.41*  CALCIUM 9.4   < >  8.6*  PROT 6.2*  --   --   BILITOT 1.6*  --   --   ALKPHOS 58  --   --   ALT 22  --   --   AST 27  --   --   GLUCOSE 136*   < > 194*   < > = values in this interval not displayed.   No results found for: CHOL, HDL, LDLCALC, TRIG BNP (last 3 results) Recent Labs    11/05/18 1028 11/12/18 1158 11/15/18 0022  BNP 1,481.0* 1,422.7* 1,479.4*    ProBNP (last 3 results) No results for input(s): PROBNP in the last 8760 hours.   Diagnostic  Studies/Procedures   No results found.  Discharge Medications   Allergies as of 11/19/2018      Reactions   Bee Venom Hives      Medication List    STOP taking these medications   furosemide 10 MG/ML injection Commonly known as:  LASIX   losartan 25 MG tablet Commonly known as:  COZAAR   metolazone 2.5 MG tablet Commonly known as:  ZAROXOLYN     TAKE these medications   allopurinol 100 MG tablet Commonly known as:  ZYLOPRIM Take 100 mg by mouth daily.   aspirin 81 MG chewable tablet Chew 1 tablet (81 mg total) by mouth daily. Start taking on:  Nov 20, 2018   fish oil-omega-3 fatty acids 1000 MG capsule Take 1 g by mouth daily.   lansoprazole 30 MG capsule Commonly known as:  PREVACID Take 30 mg by mouth daily.   nitroGLYCERIN 0.4 MG SL tablet Commonly known as:  NITROSTAT Place 1 tablet (0.4 mg total) under the tongue every 5 (five) minutes as needed for chest pain.   potassium chloride 10 MEQ tablet Commonly known as:  K-DUR Take 4 tablets (40 mEq total) by mouth 2 (two) times daily. What changed:  how much to take   pravastatin 80 MG tablet Commonly known as:  PRAVACHOL Take 80 mg by mouth daily.   torsemide 20 MG tablet Commonly known as:  DEMADEX Take 3 tablets (60 mg total) by mouth 2 (two) times daily. What changed:  how much to take       Disposition   The patient will be discharged in stable condition to home. Discharge Instructions    (HEART FAILURE PATIENTS) Call MD:  Anytime you have any of the following symptoms: 1) 3 pound weight gain in 24 hours or 5 pounds in 1 week 2) shortness of breath, with or without a dry hacking cough 3) swelling in the hands, feet or stomach 4) if you have to sleep on extra pillows at night in order to breathe.   Complete by:  As directed    Diet - low sodium heart healthy   Complete by:  As directed    Heart Failure patients record your daily weight using the same scale at the same time of day   Complete by:   As directed    Increase activity slowly   Complete by:  As directed      Follow-up Information    , Shaune Pascal, MD Follow up on 11/27/2018.   Specialty:  Cardiology Why:  10:20  Contact information: [address] [place] [address] [phone]             Duration of Discharge Encounter: Greater than 35 minutes   Signed, Darrick Grinder NP-C  11/19/2018, 12:09 PM   Agree with above.  Ready for d/c today. Have arranged for him to see Dr. Irene Limbo as outpatient for treatment of multiple myeloma with AL cardiac amyloidosis.   Glori Bickers, MD  4:04 PM

## 2018-11-19 NOTE — Progress Notes (Signed)
PICC removal complete. Site unremarkable. Confirmed with Lovey Newcomer, RN with Dr. Irene Limbo (oncology) they will contact pt with appointment. Per Lovey Newcomer, the office will call on 5/28.

## 2018-11-19 NOTE — Progress Notes (Signed)
Patient ID: Adam Lewis, male   DOB: Aug 30, 1945, 73 y.o.   MRN: [id number]     Advanced Heart Failure Rounding Note  PCP-Cardiologist: Rozann Lesches, MD   Subjective:    Remains on milrinone 0.125. Co-ox 55%  On lasix 12/hr. Good urine output. Weight down another pound. CVP 9-10  Breathing better. No CP,orthopnea or PND. Eager to go home and discuss diagnosis and prognosis with his wife. Creatinine stable. K 2.8 . UA negative for protein.    Echo: EF 45-50%, moderate LVH with speckled myocardium, normal RV, PASP 69%, IVC dilated.     Objective:   Weight Range: 86.4 kg Body mass index is 28.13 kg/m.   Vital Signs:   Temp:  [97.6 F (36.4 C)-98.8 F (37.1 C)] 97.6 F (36.4 C) (05/27 0713) Pulse Rate:  [77-113] 105 (05/27 0300) Resp:  [20-25] 20 (05/27 0300) BP: (89-111)/(64-85) 102/75 (05/27 0713) SpO2:  [88 %-99 %] 98 % (05/27 0300) Weight:  [86.4 kg-86.7 kg] 86.4 kg (05/27 0500) Last BM Date: 11/18/18  Weight change: Filed Weights   11/17/18 0500 11/18/18 0900 11/19/18 0500  Weight: 88.3 kg 86.7 kg 86.4 kg    Intake/Output:   Intake/Output Summary (Last 24 hours) at 11/19/2018 0815 Last data filed at 11/19/2018 0759 Gross per 24 hour  Intake 1246.33 ml  Output 2810 ml  Net -1563.67 ml      Physical Exam    General:  Sitting in chair No resp difficulty HEENT: normal Neck: supple. JVP 9. Carotids 2+ bilat; no bruits. No lymphadenopathy or thryomegaly appreciated. Cor: PMI nondisplaced. Tachy regular . No rubs, gallops or murmurs. Lungs: clear Abdomen: soft, nontender, nondistended. No hepatosplenomegaly. No bruits or masses. Good bowel sounds. Extremities: no cyanosis, clubbing, rash, 1+ edema  + UNNA boots Neuro: alert & orientedx3, cranial nerves grossly intact. moves all 4 extremities w/o difficulty. Affect pleasant    Telemetry   NSR 100-110 (personally reviewed)  Labs    CBC Recent Labs    11/16/18 1328  HGB 12.2*  HCT 36.0*    Basic Metabolic Panel Recent Labs    11/17/18 2108 11/18/18 0354 11/19/18 0347  NA 134* 135 133*  K 3.0* 3.0* 2.8*  CL 94* 92* 89*  CO2 '29 31 31  ' GLUCOSE 168* 188* 194*  BUN 42* 42* 41*  CREATININE 1.42* 1.34* 1.41*  CALCIUM 8.5* 8.6* 8.6*  MG 2.0  --  2.0   Liver Function Tests No results for input(s): AST, ALT, ALKPHOS, BILITOT, PROT, ALBUMIN in the last 72 hours. No results for input(s): LIPASE, AMYLASE in the last 72 hours. Cardiac Enzymes No results for input(s): CKTOTAL, CKMB, CKMBINDEX, TROPONINI in the last 72 hours.  BNP: BNP (last 3 results) Recent Labs    11/05/18 1028 11/12/18 1158 11/15/18 0022  BNP 1,481.0* 1,422.7* 1,479.4*    ProBNP (last 3 results) No results for input(s): PROBNP in the last 8760 hours.   D-Dimer No results for input(s): DDIMER in the last 72 hours. Hemoglobin A1C No results for input(s): HGBA1C in the last 72 hours. Fasting Lipid Panel No results for input(s): CHOL, HDL, LDLCALC, TRIG, CHOLHDL, LDLDIRECT in the last 72 hours. Thyroid Function Tests No results for input(s): TSH, T4TOTAL, T3FREE, THYROIDAB in the last 72 hours.  Invalid input(s): FREET3  Other results:   Imaging    No results found.   Medications:     Scheduled Medications: . allopurinol  100 mg Oral Daily  . aspirin  81 mg Oral Daily  .  Chlorhexidine Gluconate Cloth  6 each Topical Daily  . heparin  5,000 Units Subcutaneous Q8H  . loratadine  10 mg Oral Daily  . omega-3 acid ethyl esters  1 g Oral Daily  . pantoprazole  40 mg Oral Daily  . pravastatin  80 mg Oral Daily  . sodium chloride flush  10-40 mL Intracatheter Q12H  . sodium chloride flush  3 mL Intravenous Q12H    Infusions: . sodium chloride    . furosemide (LASIX) infusion 12 mg/hr (11/19/18 0641)  . milrinone 0.125 mcg/kg/min (11/18/18 2335)  . norepinephrine (LEVOPHED) Adult infusion Stopped (11/18/18 0054)    PRN Medications: sodium chloride, acetaminophen, ALPRAZolam,  docusate sodium, morphine injection, ondansetron (ZOFRAN) IV, sodium chloride flush, sodium chloride flush   Assessment/Plan   1. Acute on chronic diastolic CHF -> cardiogenic shock dut to restrictive CM from AL amyloid:  Patient has a restrictive cardiomyopathy, cardiac MRI and echo both look like cardiac amyloidosis.  He has been difficult to diurese with cardiorenal syndrome.  This admission, he developed hypotension with attempted diuresis.  - EF 45-50%. - cMRI with diffuse infiltrative process.  - Co-ox 48.7 initially. Remains on milrinone 0.125. Off NE. Co-ox 55% - Urine output picked up greatly with inotropes and lasix gtt. CVP 9-10. Will stop IV lasix later today.    2. Cardiac AL amyloidosis:  Myocardial biopsy showed amyloidosis but sample too small for mass spectrometry.  He has a monoclonal light chain protein on SPEP and UFE.  Bone marrow biopsy showed no monoclonal B cell population on flow cytometry but immunochemistry positive for multiple myeloma with plasma cells 17% by aspirate, 30-40% CD138 PYP scan was equivocal for TTR amyloidosis (H/CL 1.58, grade 1 visually).  - I spoke with Dr. Irene Limbo from hematology. Await next steps. We can support with home inotrope therapy if needed however given advanced cardiac involvement benefits of therapy (and ability to tolerate) may be limited. I do think it is worth a shot at treatment for him and see how he does although with severe cardiac involvement I worry it may be a difficult road with a less than favorable outcome.   3. AKI on CKD: - Stage 3, baseline creatinine around 1.5. Peaked at 1.9. Now 1.4 - Likely due to cardiorenal. Improved with inotrope support - UA negative for protein   4. Hypokalemia - will supp. Hold IV lasix f r now  I had long talk with him about the diagnosis of multiple myeloma and related severe cardiac amyloidosis and the prognosis associated with it once significant HF is present (50% mortality at 6 months). He  was understandably frustrated and worried about his family. I offered to call his wife and children but he says he would like to tell them personally. His daughter is driving down from [place] today.   We discussed options and he would like to go home and discuss with his wife and daughter before starting chemo. Will stop milrinone and pull PICC. We have arranged f/u for him with Dr. Irene Limbo next week. He realizes that his HF may get worse off milrinone and may need to be readmitted.   Increase torsemide to 60 daily. Home today once K supped.  Length of Stay: [place], MD  11/19/2018, 8:15 AM  Advanced Heart Failure Team Pager [phone] (M-F; [place])  Please contact [facility] for night-coverage after hours (4p -7a ) and weekends on [email or web address]

## 2018-11-19 NOTE — Progress Notes (Signed)
After reviewing discharge instructions with patient, was taken via wheelchair to waiting vehicle.  Wife did bring his home oxygen and assisted hime into the car

## 2018-11-20 ENCOUNTER — Inpatient Hospital Stay: Payer: PPO | Attending: Hematology | Admitting: Hematology

## 2018-11-20 ENCOUNTER — Telehealth: Payer: Self-pay | Admitting: *Deleted

## 2018-11-20 ENCOUNTER — Other Ambulatory Visit: Payer: Self-pay

## 2018-11-20 VITALS — BP 95/63 | HR 103 | Temp 99.1°F | Resp 18 | Ht 69.0 in | Wt 189.6 lb

## 2018-11-20 DIAGNOSIS — C9000 Multiple myeloma not having achieved remission: Secondary | ICD-10-CM | POA: Diagnosis not present

## 2018-11-20 DIAGNOSIS — R0602 Shortness of breath: Secondary | ICD-10-CM | POA: Insufficient documentation

## 2018-11-20 DIAGNOSIS — E8581 Light chain (AL) amyloidosis: Secondary | ICD-10-CM

## 2018-11-20 DIAGNOSIS — Z79899 Other long term (current) drug therapy: Secondary | ICD-10-CM | POA: Diagnosis not present

## 2018-11-20 DIAGNOSIS — Z7189 Other specified counseling: Secondary | ICD-10-CM

## 2018-11-20 DIAGNOSIS — K5900 Constipation, unspecified: Secondary | ICD-10-CM | POA: Diagnosis not present

## 2018-11-20 DIAGNOSIS — I509 Heart failure, unspecified: Secondary | ICD-10-CM | POA: Diagnosis not present

## 2018-11-20 DIAGNOSIS — E781 Pure hyperglyceridemia: Secondary | ICD-10-CM | POA: Insufficient documentation

## 2018-11-20 DIAGNOSIS — K219 Gastro-esophageal reflux disease without esophagitis: Secondary | ICD-10-CM | POA: Diagnosis not present

## 2018-11-20 DIAGNOSIS — M1990 Unspecified osteoarthritis, unspecified site: Secondary | ICD-10-CM | POA: Diagnosis not present

## 2018-11-20 DIAGNOSIS — I10 Essential (primary) hypertension: Secondary | ICD-10-CM | POA: Diagnosis not present

## 2018-11-20 DIAGNOSIS — E785 Hyperlipidemia, unspecified: Secondary | ICD-10-CM

## 2018-11-20 DIAGNOSIS — I429 Cardiomyopathy, unspecified: Secondary | ICD-10-CM | POA: Diagnosis not present

## 2018-11-20 DIAGNOSIS — Z87891 Personal history of nicotine dependence: Secondary | ICD-10-CM

## 2018-11-20 NOTE — Progress Notes (Signed)
HEMATOLOGY/ONCOLOGY CLINIC NOTE  Date of Service: 11/20/2018  Patient Care Team: Redmond School, MD as PCP - General (Internal Medicine) Satira Sark, MD as PCP - Cardiology (Cardiology) Bensimhon, Shaune Pascal, MD as PCP - Advanced Heart Failure (Cardiology)  CHIEF COMPLAINTS/PURPOSE OF CONSULTATION:  Plasma Cell Dyscrasia  HISTORY OF PRESENTING ILLNESS:  Adam Lewis is a wonderful 73 y.o. 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 He is currently admitted with decompensated heart failure.  Patient has a history of hypertension, chronic diastolic heart failure with restrictive cardiomyopathy with dyspnea on exertion, GERD, dyslipidemia and osteoarthritis who reports progressively worsening dyspnea on exertion and shortness of breath over the last 6 to 8 months. Patient has been following up with cardiology. Echocardiogram done on 01/2018 showed EF 55-60% with moderate LVH restrictive physiology, RVSP 38 mmHg.  He subsequently had cardiac catheterization in August 2019 which showed normal coronaries. Cardiac MRI done on 08/14/2018 showed1. Septal hypertrophy normal LV size mild diffuse hypokinesis EF 49% 2. Markedly abnormal delayed gadolinium images with severe diffuse myocardial uptake suggesting severe infiltrative cardiomyopathy. Consider biopsy and genetic testing  Patient was subsequently referred to [facility] and had an endomyocardial biopsy on 09/04/2018 which showed  Endomyocardial biopsy; diagnostic biopsy:  Cardiac amyloidosis, mild infiltrative and nodular pattern - See comment Mild myocyte hypertrophy with mild to moderate lipofuscinosis No evidence of myocarditis or granulomatous inflammation Comment:  The biopsy tissue demonstrates several foci of myocyte dropout with replacement by a pale waxy material that suggests amyloid but has some atypical  staining. This likely reflects preservation/processing issue.  Deeper sections no longer contain these foci, but some additional areas with similar interstitial material  are present.  The section for Congo Red stain does not contain any of these foci; however, there is at least on focus of slight interstitial expansion by Congophilic material that exhibits typical birefringence under polarization.  Electron microscopic confirmation will be attempted and expedited. Additionally, the remaining tissue block will be sent for amyloid mass spectrometry. The case has been reviewed with Dr. Tonye Royalty, also of cardiovascular pathology, who is in agreement with the diagnosis.  Apparently cardiac endomyocardial biopsy sample was inadequate for more spectrometric analysis to determine the type of amyloid.  Lab testing on 09/25/2018 for serum free light chains showed significantly elevated lambda free light chains at 2293.1 with normal kappa free light chains with significantly normal ratio. Urine random immunofixation electrophoresis showed Bence-Jones protein lambda type.  Patient subsequently had a CT-guided bone marrow biopsy on 10/24/2018 which showed evidence of plasma cell myeloma with lambda restricted plasma cells at 17% by aspirate and 30 to 40% based on CD 138 immunohistochemistry. Congo red was negative for amyloid on the bone marrow biopsy.  Patient is currently admitted decompensation of his restrictive cardiomyopathy due to amyloidosis with difficult to manage volume status and acute kidney injury. He is currently on a Levophed and Lasix drip with good diuresis and notes that his shortness of breath is improved.  He notes that until summer of last year he was very active and walked his pet regularly several times a day.  Since fall of last year he has noted worsening shortness of breath and dyspnea on exertion. Which is progressively gotten worse.  He just had a discussion with Dr. Haroldine Laws  regarding the concerning prognosis from a cardiology standpoint.  He is concerned about what this means  for his wife and family.  We spent a significant bit of time talking about his diagnosis of plasma cell dyscrasia with AL amyloidosis and additional diagnostics including a 24-hour urine, labs, outpatient PET CT scan to rule out bone lesions and natural history and treatment approaches.  He realizes in the current focus of treatment is to get his heart failure under better control.  Interval History:   Adam Lewis returns today for management and evaluation of his Plasma Cell Dyscrasia. The patient's last visit with [place] was on 11/17/18. The pt reports that he is doing well overall. The pt is accompanied today by his wife and daughter via Social worker.  The pt reports that he has had worsening SOB since last year. ECHO revealed thickness and restrictive cardiomyopathy. Cardiac MRI concerning for infiltrative process. Endomyocardial biopsy at [facility] and pyrophosphate scan performed. Pt then had BM Bx in May which revealed clonal plasma cells.  The pt notes that his BP has been normalizing since his recent discharge. He notes that he has been set up with home healthcare services.   The pt endorses some mild constipation. He notes that he has had stable ankle swelling and has his legs wrapped currently. Pt is currently taking 92m Lasix BID and 464m Potassium BID. Pt will follow up with Cardiology on 11/27/18.  The pt denies any skin changes, concerns for bleeding or bruising. He also denies any sensory changes in his tongue or mouth.  The pt notes that he has received the older shingles vaccine.  Lab results of 11/14/18 CBC and BMP is as follows: all values are WNL except for RBC at 3.80, HGB at 12.4, HCT at 36.6, Sodium at 133, Potassium at 3.4, Chloride at 89, CO2 at 34, Glucose at 130, BUN at 43, Creatinine at 1.36, GFR at 52. 11/18/18 SFLC revealed Lambda light chains at 3084.2m57mnd Kappa  free light chains at 13.2mg57mn review of systems, pt reports stable energy levels, stable breathing, eating well, ankle swelling, mild constipation, and denies concerns for infections, skin changes, bleeding, bruising, and any other symptoms.   MEDICAL HISTORY:  Past Medical History:  Diagnosis Date  . Essential hypertension   . Family history of adverse reaction to anesthesia    [place]  . GERD (gastroesophageal reflux disease)   . Hypertriglyceridemia   . Osteoarthritis     SURGICAL HISTORY: Past Surgical History:  Procedure Laterality Date  . CARPAL TUNNEL RELEASE    . COLONOSCOPY  01/02/2011   Procedure: COLONOSCOPY;  Surgeon: MarkJamesetta Soocation: [facility] ENDO SUITE;  Service: Gastroenterology;  Laterality: N/A;  . LEFT HEART CATH AND CORONARY ANGIOGRAPHY N/A 02/18/2018   Procedure: LEFT HEART CATH AND CORONARY ANGIOGRAPHY;  Surgeon: KellTroy Sine;  [facility];  Service: Cardiovascular;  Laterality: N/A;  . LITHOTRIPSY  05/2009  . 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    SOCIAL HISTORY: Social History   Socioeconomic History  . Marital status: Married    Spouse name: Not on file  . Number of children: Not on file  . Years of education: Not on file  . Highest education level: Not on file  Occupational History  . Not on file  Social Needs  . Financial resource strain: Not on file  . Food insecurity:    Worry: Not on file    Inability: Not on file  . Transportation needs:  Medical: Not on file    Non-medical: Not on file  Tobacco Use  . Smoking status: Former Smoker    Types: Cigarettes    Last attempt to quit: 07/16/1977    Years since quitting: 41.3  . Smokeless tobacco: Never Used  Substance and Sexual Activity  . Alcohol use: Yes    Alcohol/week: 16.0 standard drinks    Types: 1 Glasses of wine, 14 Cans of beer, 1 Shots of liquor per week  . Drug use: No  . Sexual activity:  Not on file  Lifestyle  . Physical activity:    Days per week: Not on file    Minutes per session: Not on file  . Stress: Not on file  Relationships  . Social connections:    Talks on phone: Not on file    Gets together: Not on file    Attends religious service: Not on file    Active member of club or organization: Not on file    Attends meetings of clubs or organizations: Not on file    Relationship status: Not on file  . Intimate partner violence:    Fear of current or ex partner: Not on file    Emotionally abused: Not on file    Physically abused: Not on file    Forced sexual activity: Not on file  Other Topics Concern  . Not on file  Social History Narrative  . Not on file    FAMILY HISTORY: Family History  Problem Relation Age of Onset  . Hypertension Father   . Heart disease Father     ALLERGIES:  is allergic to bee venom.  MEDICATIONS:  Current Outpatient Medications  Medication Sig Dispense Refill  . allopurinol (ZYLOPRIM) 100 MG tablet Take 100 mg by mouth daily.   3  . aspirin 81 MG chewable tablet Chew 1 tablet (81 mg total) by mouth daily. 30 tablet 6  . fish oil-omega-3 fatty acids 1000 MG capsule Take 1 g by mouth daily.     . lansoprazole (PREVACID) 30 MG capsule Take 30 mg by mouth daily.      . nitroGLYCERIN (NITROSTAT) 0.4 MG SL tablet Place 1 tablet (0.4 mg total) under the tongue every 5 (five) minutes as needed for chest pain. 25 tablet 3  . potassium chloride (K-DUR) 10 MEQ tablet Take 4 tablets (40 mEq total) by mouth 2 (two) times daily. 240 tablet 6  . pravastatin (PRAVACHOL) 80 MG tablet Take 80 mg by mouth daily.   3  . torsemide (DEMADEX) 20 MG tablet Take 3 tablets (60 mg total) by mouth 2 (two) times daily. 180 tablet 6   No current facility-administered medications for this visit.     REVIEW OF SYSTEMS:    10 Point review of Systems was done is negative except as noted above.  PHYSICAL EXAMINATION: ECOG PERFORMANCE STATUS: 2 -  Symptomatic, <50% confined to bed  . Vitals:   11/20/18 1323  BP: 95/63  Pulse: (!) 103  Resp: 18  Temp: 99.1 F (37.3 C)  SpO2: 100%   Filed Weights   11/20/18 1323  Weight: 189 lb 9.6 oz (86 kg)   .Body mass index is 28 kg/m.  GENERAL:alert, in no acute distress and comfortable SKIN: no acute rashes, no significant lesions EYES: conjunctiva are pink and non-injected, sclera anicteric OROPHARYNX: MMM, no exudates, no oropharyngeal erythema or ulceration NECK: supple, no JVD LYMPH:  no palpable lymphadenopathy in the cervical, axillary or inguinal regions LUNGS: clear to  auscultation b/l with normal respiratory effort HEART: regular rate & rhythm ABDOMEN:  normoactive bowel sounds , non tender, not distended. Extremity: lower extremities wrapped for pedal edema PSYCH: alert & oriented x 3 with fluent speech NEURO: no focal motor/sensory deficits  LABORATORY DATA:  I have reviewed the data as listed  . CBC Latest Ref Rng & Units 11/16/2018 11/14/2018 11/12/2018  WBC 4.0 - 10.5 K/uL - 7.5 8.2  Hemoglobin 13.0 - 17.0 g/dL 12.2(L) 12.4(L) 12.6(L)  Hematocrit 39.0 - 52.0 % 36.0(L) 36.6(L) 37.2(L)  Platelets 150 - 400 K/uL - 229 211    . CMP Latest Ref Rng & Units 11/19/2018 11/19/2018 11/18/2018  Glucose 70 - 99 mg/dL 130(H) 194(H) 188(H)  BUN 8 - 23 mg/dL 43(H) 41(H) 42(H)  Creatinine 0.61 - 1.24 mg/dL 1.36(H) 1.41(H) 1.34(H)  Sodium 135 - 145 mmol/L 133(L) 133(L) 135  Potassium 3.5 - 5.1 mmol/L 3.4(L) 2.8(L) 3.0(L)  Chloride 98 - 111 mmol/L 89(L) 89(L) 92(L)  CO2 22 - 32 mmol/L 34(H) 31 31  Calcium 8.9 - 10.3 mg/dL 9.0 8.6(L) 8.6(L)  Total Protein 6.5 - 8.1 g/dL - - -  Total Bilirubin 0.3 - 1.2 mg/dL - - -  Alkaline Phos 38 - 126 U/L - - -  AST 15 - 41 U/L - - -  ALT 0 - 44 U/L - - -    10/24/18 BM Bx:        RADIOGRAPHIC STUDIES: I have personally reviewed the radiological images as listed and agreed with the findings in the report. Dg Chest 2 View   Result Date: 11/14/2018 CLINICAL DATA:  Hypotension EXAM: CHEST - 2 VIEW COMPARISON:  Chest x-ray dated 07/30/2018 FINDINGS: The cardiac silhouette is mildly enlarged. There are new small bilateral pleural effusions, right worse than left. There are bibasilar airspace opacities favored to represent compressive atelectasis. There is generalized volume overload. No pneumothorax. The pulmonary arteries appears somewhat dilated which can be seen in patients with elevated PA pressures. There is no acute osseous abnormality. IMPRESSION: 1. Small bilateral pleural effusions, right worse than left. These have increased significantly since prior chest x-ray. 2. Borderline enlarged heart.  Generalized volume overload is noted. 3. Bibasilar airspace opacities favored to represent compressive atelectasis. Electronically Signed   By: Constance Holster M.D.   On: 11/14/2018 18:35   Ct Biopsy  Result Date: 10/24/2018 INDICATION: 73 year old male with amyloid heart disease. Bone marrow biopsy is requested for further specification. EXAM: CT GUIDED BONE MARROW ASPIRATION AND CORE BIOPSY Interventional Radiologist:  Criselda Peaches, MD MEDICATIONS: None. ANESTHESIA/SEDATION: A total of 1 mg Versed was administered for anxiety lysis. Hypertension prevented additional sedation. FLUOROSCOPY TIME:  None. COMPLICATIONS: None immediate. Estimated blood loss: <25 mL PROCEDURE: Informed written consent was obtained from the patient after a thorough discussion of the procedural risks, benefits and alternatives. All questions were addressed. Maximal Sterile Barrier Technique was utilized including caps, mask, sterile gowns, sterile gloves, sterile drape, hand hygiene and skin antiseptic. A timeout was performed prior to the initiation of the procedure. The patient was positioned prone and non-contrast localization CT was performed of the pelvis to demonstrate the iliac marrow spaces. Maximal barrier sterile technique utilized  including caps, mask, sterile gowns, sterile gloves, large sterile drape, hand hygiene, and betadine prep. Under sterile conditions and local anesthesia, an 11 gauge coaxial bone biopsy needle was advanced into the left iliac marrow space. Needle position was confirmed with CT imaging. Initially, bone marrow aspiration was performed. Next, the 11 gauge  outer cannula was utilized to obtain a left iliac bone marrow core biopsy. Needle was removed. Hemostasis was obtained with compression. The patient tolerated the procedure well. Samples were prepared with the cytotechnologist. IMPRESSION: Technically successful CT-guided bone marrow aspiration and biopsy of the left iliac bone. Electronically Signed   By: Jacqulynn Cadet M.D.   On: 10/24/2018 12:14   Ct Bone Marrow Biopsy & Aspiration  Result Date: 10/24/2018 INDICATION: 73 year old male with amyloid heart disease. Bone marrow biopsy is requested for further specification. EXAM: CT GUIDED BONE MARROW ASPIRATION AND CORE BIOPSY Interventional Radiologist:  Criselda Peaches, MD MEDICATIONS: None. ANESTHESIA/SEDATION: A total of 1 mg Versed was administered for anxiety lysis. Hypertension prevented additional sedation. FLUOROSCOPY TIME:  None. COMPLICATIONS: None immediate. Estimated blood loss: <25 mL PROCEDURE: Informed written consent was obtained from the patient after a thorough discussion of the procedural risks, benefits and alternatives. All questions were addressed. Maximal Sterile Barrier Technique was utilized including caps, mask, sterile gowns, sterile gloves, sterile drape, hand hygiene and skin antiseptic. A timeout was performed prior to the initiation of the procedure. The patient was positioned prone and non-contrast localization CT was performed of the pelvis to demonstrate the iliac marrow spaces. Maximal barrier sterile technique utilized including caps, mask, sterile gowns, sterile gloves, large sterile drape, hand hygiene, and betadine  prep. Under sterile conditions and local anesthesia, an 11 gauge coaxial bone biopsy needle was advanced into the left iliac marrow space. Needle position was confirmed with CT imaging. Initially, bone marrow aspiration was performed. Next, the 11 gauge outer cannula was utilized to obtain a left iliac bone marrow core biopsy. Needle was removed. Hemostasis was obtained with compression. The patient tolerated the procedure well. Samples were prepared with the cytotechnologist. IMPRESSION: Technically successful CT-guided bone marrow aspiration and biopsy of the left iliac bone. Electronically Signed   By: Jacqulynn Cadet M.D.   On: 10/24/2018 12:14   [place] Ekg Site Rite  Result Date: 11/16/2018 If Site Rite image not attached, placement could not be confirmed due to current cardiac rhythm.   ASSESSMENT & PLAN:  73 y.o. 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 Mol Cy - t(11;14) -- standard risk cytogenetics No anemia, hypercalcemia. ? Renal involvement ? Bone lesions.  3) AKI -could be from cardiorenal syndrome. Cannot rule out possible contribution from his light chain multiple myeloma especially since his involved light chain is more than 1000.  4) Hypokalemia-we will need aggressive replacement.  Likely related to his diuretics. Following with Dr. Glori Bickers in Cardiology   PLAN -Discussed pt labwork; 11/18/18 SFLC revealed Kappa light chains at 13.81m and Lambda light chains at 3084.832m-Reviewed previous 07/22/18 MMP which did not reveal an M spike -11/18/18 24-hour UPEP with light chains pending. Patient potentially has developed significant proteinuria which might be a marker for AL amyloidosis affecting his kidneys this could  make managing third space fluids more difficult if present. -Discussed and reviewed all work up so far with the pt and his family -Follow up with salt restrictions, diuretics, and electrolyte management per Dr. Haroldine Laws in Cardiology -Myeloma is causing amyloidosis, however when considering CRAB criteria: no hypercalcemia, renal function will be further characterized by 24hr UPEP, will need to trend out hemoglobin, and will need to evaluate for bone tumors -Standard risk genetics -Discussed option of port placement which pt will consider -Recommend Miralax daily and Senna S -Will begin treatment immanently with Cyclophosphamide, Velcade and Dexamethasone due to cardiac involvement and possibly kidneys as well. Discussed possible side effects. No grapefruit juice. -Will need to dose reduce dexamethasone from 24m to 10-149mto avoid fluid overload. -Will start Cyclophosphamide at 50043mWill begin Acyclovir BID for shingles prophylaxis -Will plan to begin C1 CyBorD next week -Will order PET/CT -Proceed with chemotherapy counseling tomorrow 11/21/18 -Will see the pt back on C1D8 for toxicity check  -plz schedule C1 CyBorD ASAP -PET/CT ASAP -completed chemotherapy counseling 11/21/18 -Will see the pt back on C1D8 for toxicity check.   All of the patients questions were answered with apparent satisfaction. The patient knows to call the clinic with any problems, questions or concerns.  The total time spent in the appt was 60 minutes and more than 50% was on counseling and direct patient cares.    GauSullivan Lone MS AAHIVMS [facility] - Lanierematology/Oncology Physician [facility]Special[facility] AscOffice):       [phone] cell):  [phone]):           [phone]/28/2020 2:51 PM  I, SchBaldwin Jamaicam acting as a scribe for Dr. GauSullivan Lone .I have reviewed the above documentation for accuracy and completeness, and I agree with the above. .GaBrunetta Genera

## 2018-11-20 NOTE — Telephone Encounter (Signed)
Patient contacted office requesting labs prior to new patient appt - stating that that's how Dr. Beryle Beams did it, so he was able to discuss results during appointment. Per Dr. Irene Limbo, add lab appointment prior to MD appt. Contacted patient with this information and time of lab appt.

## 2018-11-20 NOTE — Telephone Encounter (Signed)
Contacted patient per Dr. Irene Limbo directions: Patient to come in today at 1pm  to see Dr. Irene Limbo. Message sent to New Patient coordinator.  Schedule request sent for patient be scheduled for Patient Education tomorrow at 4pm (open on schedule). Patient aware of and in agreement with each time.

## 2018-11-21 ENCOUNTER — Telehealth: Payer: Self-pay | Admitting: *Deleted

## 2018-11-21 ENCOUNTER — Telehealth: Payer: Self-pay | Admitting: Hematology

## 2018-11-21 ENCOUNTER — Inpatient Hospital Stay: Payer: PPO

## 2018-11-21 DIAGNOSIS — Z9981 Dependence on supplemental oxygen: Secondary | ICD-10-CM | POA: Diagnosis not present

## 2018-11-21 DIAGNOSIS — I5033 Acute on chronic diastolic (congestive) heart failure: Secondary | ICD-10-CM | POA: Diagnosis not present

## 2018-11-21 DIAGNOSIS — J9611 Chronic respiratory failure with hypoxia: Secondary | ICD-10-CM | POA: Diagnosis not present

## 2018-11-21 DIAGNOSIS — Z87891 Personal history of nicotine dependence: Secondary | ICD-10-CM | POA: Diagnosis not present

## 2018-11-21 DIAGNOSIS — C9000 Multiple myeloma not having achieved remission: Secondary | ICD-10-CM | POA: Diagnosis not present

## 2018-11-21 DIAGNOSIS — M1991 Primary osteoarthritis, unspecified site: Secondary | ICD-10-CM | POA: Diagnosis not present

## 2018-11-21 DIAGNOSIS — N183 Chronic kidney disease, stage 3 (moderate): Secondary | ICD-10-CM | POA: Diagnosis not present

## 2018-11-21 DIAGNOSIS — E782 Mixed hyperlipidemia: Secondary | ICD-10-CM | POA: Diagnosis not present

## 2018-11-21 DIAGNOSIS — I425 Other restrictive cardiomyopathy: Secondary | ICD-10-CM | POA: Diagnosis not present

## 2018-11-21 DIAGNOSIS — I130 Hypertensive heart and chronic kidney disease with heart failure and stage 1 through stage 4 chronic kidney disease, or unspecified chronic kidney disease: Secondary | ICD-10-CM | POA: Diagnosis not present

## 2018-11-21 DIAGNOSIS — I5020 Unspecified systolic (congestive) heart failure: Secondary | ICD-10-CM | POA: Diagnosis not present

## 2018-11-21 DIAGNOSIS — K219 Gastro-esophageal reflux disease without esophagitis: Secondary | ICD-10-CM | POA: Diagnosis not present

## 2018-11-21 LAB — UPEP/UIFE/LIGHT CHAINS/TP, 24-HR UR
% BETA, Urine: 2.2 %
ALPHA 1 URINE: 0.7 %
Albumin, U: 4 %
Alpha 2, Urine: 1.7 %
Free Kappa Lt Chains,Ur: 18.65 mg/L (ref 0.63–113.79)
Free Kappa/Lambda Ratio: <0.01 — ABNORMAL LOW (ref 1.03–31.76)
Free Lambda Lt Chains,Ur: 5208.37 mg/L — ABNORMAL HIGH (ref 0.47–11.77)
GAMMA GLOBULIN URINE: 91.4 %
M-SPIKE %, Urine: 88.4 % — ABNORMAL HIGH
M-Spike, Mg/24 Hr: 6387 mg/24 hr — ABNORMAL HIGH
Total Protein, Urine-Ur/day: 7225 mg/24 hr — ABNORMAL HIGH (ref 30–150)
Total Protein, Urine: 164.2 mg/dL
Total Volume: 4400

## 2018-11-21 NOTE — Telephone Encounter (Signed)
No los per 5/28. °

## 2018-11-24 ENCOUNTER — Other Ambulatory Visit (HOSPITAL_COMMUNITY): Payer: Self-pay | Admitting: *Deleted

## 2018-11-25 DIAGNOSIS — E663 Overweight: Secondary | ICD-10-CM | POA: Diagnosis not present

## 2018-11-25 DIAGNOSIS — Z1389 Encounter for screening for other disorder: Secondary | ICD-10-CM | POA: Diagnosis not present

## 2018-11-25 DIAGNOSIS — I509 Heart failure, unspecified: Secondary | ICD-10-CM | POA: Diagnosis not present

## 2018-11-25 DIAGNOSIS — Z6827 Body mass index (BMI) 27.0-27.9, adult: Secondary | ICD-10-CM | POA: Diagnosis not present

## 2018-11-25 DIAGNOSIS — E854 Organ-limited amyloidosis: Secondary | ICD-10-CM | POA: Diagnosis not present

## 2018-11-25 DIAGNOSIS — I10 Essential (primary) hypertension: Secondary | ICD-10-CM | POA: Diagnosis not present

## 2018-11-25 DIAGNOSIS — I5032 Chronic diastolic (congestive) heart failure: Secondary | ICD-10-CM | POA: Diagnosis not present

## 2018-11-25 DIAGNOSIS — N179 Acute kidney failure, unspecified: Secondary | ICD-10-CM | POA: Diagnosis not present

## 2018-11-25 DIAGNOSIS — F329 Major depressive disorder, single episode, unspecified: Secondary | ICD-10-CM | POA: Diagnosis not present

## 2018-11-26 ENCOUNTER — Telehealth: Payer: Self-pay | Admitting: *Deleted

## 2018-11-26 NOTE — Telephone Encounter (Signed)
Contacted patient to review upcoming appointments, next is 11/28/2018. Patient states he has MyChart and he will check there as well for rest of the appointments.

## 2018-11-27 ENCOUNTER — Other Ambulatory Visit: Payer: Self-pay

## 2018-11-27 ENCOUNTER — Other Ambulatory Visit (HOSPITAL_COMMUNITY): Payer: Self-pay | Admitting: Internal Medicine

## 2018-11-27 ENCOUNTER — Encounter (HOSPITAL_COMMUNITY): Payer: Self-pay | Admitting: Internal Medicine

## 2018-11-27 ENCOUNTER — Ambulatory Visit (HOSPITAL_COMMUNITY)
Admission: RE | Admit: 2018-11-27 | Discharge: 2018-11-27 | Disposition: A | Discharge status: Home / Self Care | Payer: PPO | Source: Ambulatory Visit | Attending: Internal Medicine | Admitting: Internal Medicine

## 2018-11-27 VITALS — HR 93 | Wt 187.0 lb

## 2018-11-27 DIAGNOSIS — Z87891 Personal history of nicotine dependence: Secondary | ICD-10-CM | POA: Diagnosis not present

## 2018-11-27 DIAGNOSIS — I130 Hypertensive heart and chronic kidney disease with heart failure and stage 1 through stage 4 chronic kidney disease, or unspecified chronic kidney disease: Secondary | ICD-10-CM | POA: Diagnosis not present

## 2018-11-27 DIAGNOSIS — E781 Pure hyperglyceridemia: Secondary | ICD-10-CM | POA: Diagnosis not present

## 2018-11-27 DIAGNOSIS — Z8249 Family history of ischemic heart disease and other diseases of the circulatory system: Secondary | ICD-10-CM | POA: Insufficient documentation

## 2018-11-27 DIAGNOSIS — I5032 Chronic diastolic (congestive) heart failure: Secondary | ICD-10-CM | POA: Insufficient documentation

## 2018-11-27 DIAGNOSIS — M1990 Unspecified osteoarthritis, unspecified site: Secondary | ICD-10-CM | POA: Insufficient documentation

## 2018-11-27 DIAGNOSIS — Z79899 Other long term (current) drug therapy: Secondary | ICD-10-CM | POA: Insufficient documentation

## 2018-11-27 DIAGNOSIS — J9611 Chronic respiratory failure with hypoxia: Secondary | ICD-10-CM | POA: Diagnosis not present

## 2018-11-27 DIAGNOSIS — K219 Gastro-esophageal reflux disease without esophagitis: Secondary | ICD-10-CM | POA: Insufficient documentation

## 2018-11-27 DIAGNOSIS — Z7982 Long term (current) use of aspirin: Secondary | ICD-10-CM | POA: Insufficient documentation

## 2018-11-27 DIAGNOSIS — N183 Chronic kidney disease, stage 3 (moderate): Secondary | ICD-10-CM | POA: Insufficient documentation

## 2018-11-27 DIAGNOSIS — E8581 Light chain (AL) amyloidosis: Secondary | ICD-10-CM | POA: Diagnosis not present

## 2018-11-27 DIAGNOSIS — J90 Pleural effusion, not elsewhere classified: Secondary | ICD-10-CM

## 2018-11-27 DIAGNOSIS — I5022 Chronic systolic (congestive) heart failure: Secondary | ICD-10-CM

## 2018-11-27 DIAGNOSIS — E854 Organ-limited amyloidosis: Secondary | ICD-10-CM | POA: Diagnosis not present

## 2018-11-27 DIAGNOSIS — I43 Cardiomyopathy in diseases classified elsewhere: Secondary | ICD-10-CM | POA: Insufficient documentation

## 2018-11-27 DIAGNOSIS — C9000 Multiple myeloma not having achieved remission: Secondary | ICD-10-CM | POA: Insufficient documentation

## 2018-11-27 DIAGNOSIS — I425 Other restrictive cardiomyopathy: Secondary | ICD-10-CM | POA: Insufficient documentation

## 2018-11-27 LAB — BASIC METABOLIC PANEL
Anion gap: 10 (ref 5–15)
BUN: 64 mg/dL — ABNORMAL HIGH (ref 8–23)
CO2: 32 mmol/L (ref 22–32)
Calcium: 9.4 mg/dL (ref 8.9–10.3)
Chloride: 95 mmol/L — ABNORMAL LOW (ref 98–111)
Creatinine, Ser: 2.25 mg/dL — ABNORMAL HIGH (ref 0.61–1.24)
GFR calc Af Amer: 33 mL/min — ABNORMAL LOW (ref >60)
GFR calc non Af Amer: 28 mL/min — ABNORMAL LOW (ref >60)
Glucose, Bld: 139 mg/dL — ABNORMAL HIGH (ref 70–99)
Potassium: 5.4 mmol/L — ABNORMAL HIGH (ref 3.5–5.1)
Sodium: 137 mmol/L (ref 135–145)

## 2018-11-27 LAB — BRAIN NATRIURETIC PEPTIDE: B Natriuretic Peptide: 3100.8 pg/mL — ABNORMAL HIGH (ref 0.0–100.0)

## 2018-11-27 MED ORDER — METOLAZONE 5 MG PO TABS: 5.0000 mg | ORAL_TABLET | ORAL | 3 refills | Status: AC

## 2018-11-27 MED ORDER — POTASSIUM CHLORIDE CRYS ER 10 MEQ PO TBCR: 40.0000 meq | EXTENDED_RELEASE_TABLET | Freq: Two times a day (BID) | ORAL | 6 refills | Status: AC

## 2018-11-27 MED ORDER — MIDODRINE HCL 5 MG PO TABS: 5.0000 mg | ORAL_TABLET | Freq: Three times a day (TID) | ORAL | 3 refills | Status: AC

## 2018-11-27 NOTE — Progress Notes (Signed)
Heart Failure Clinic Note  Date:  11/27/2018   ID:  Adam Lewis, DOB February 08, 1946, MRN [id number]  Location: Home  Provider location: [facility] Type of Visit: Established patient  PCP:  Redmond School, MD  Cardiologist:  Rozann Lesches, MD Primary HF: [place] Oncology: Dr Irene Limbo   History of Present Illness:  Adam Lewis is a 73 y.o. male with a history of hypertension and mixed hyperlipidemiainitially referred by Dr. 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   He underwent cardiac biopsy and RHC at [facility] which confirmed cardiac amyloid but not lare enough sample for mass spectrometry. Had UPEP and Urine IFE with m-spike and Lambda light chains.   He had a telehealth visit 09/30/18 and reported that he was doing well. Dr Haroldine Laws set him up for bone marrow biopsy in IR, which came back normal. Tafamidis paperwork has begun.   Admitted 11/14/2018-11/19/18 with marked volume overloadedInitial mixed venous saturation was 48%. Due to sluggish response to IV diuretics, milrinone was started. Diuresis was also limited due to hypotension so norepinephrine was added. As he improved norepinephrine and milrinone were weaned off. Over all he diuresed 9 pounds. 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 He will follow up with Dr Irene Limbo with plan for PET scan and to start Cytoxan/Bortezomib and Dexamethasoneregimen for his multiple myeloma and AL amyloidosis. D/w weight 183  Last week he had follow up with Dr Irene Limbo and will start chemo tomorroww. PET/CT scans ordered.   Today he returns for post hospital follow up. He remains very limited. SOB with any activity. + orthopnea and PND. + LE edema. Taking torsemide 60 bid with mild response. Weight steady at 182 pounds  He denies symptoms worrisome for COVID 19.   Studies  - Echo 8/19 EF 55-60% with moderate LVH restrictive physiology. RVSP  42mHG. - Cardiac catheterization 8/19 demonstrated normal coronary arteries.LVEDP 27.  - Chest CT scan 12/19: Moderate layering pleural effusions and minimal pulmonary edema.Negative for pulmonary embolism or sarcoid. - cMRI 08/14/18 1. Septal hypertrophy normal LV size mild diffuse hypokinesis EF 49% 2. Markedly abnormal delayed gadolinium images with severe diffuse myocardial uptake suggesting severe infiltrative cardiomyopathy. Consider biopsy and genetic testing 3. Mild RV enlargement 4. Small circumferential pericardial effusion 5. Mild bi-atrial enlargement - PYP 3/20: Quantitative H/CL 1.58 but visually equivocal  Review of systems complete and found to be negative unless listed in HPI.   Past Medical History:  Diagnosis Date  . Essential hypertension   . Family history of adverse reaction to anesthesia    [place]  . GERD (gastroesophageal reflux disease)   . Hypertriglyceridemia   . Osteoarthritis    Past Surgical History:  Procedure Laterality Date  . CARPAL TUNNEL RELEASE    . COLONOSCOPY  01/02/2011   Procedure: COLONOSCOPY;  Surgeon: MJamesetta So  Location: [facility];  Service: Gastroenterology;  Laterality: N/A;  . LEFT HEART CATH AND CORONARY ANGIOGRAPHY N/A 02/18/2018   Procedure: LEFT HEART CATH AND CORONARY ANGIOGRAPHY;  Surgeon: KTroy Sine MD;  [facility];  Service: Cardiovascular;  Laterality: N/A;  . LITHOTRIPSY  05/2009  . 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 allopurinol (ZYLOPRIM) 100 MG tablet Take 100 mg by mouth daily.   3  . aspirin 81  MG chewable tablet Chew 1 tablet (81 mg total) by mouth daily. 30 tablet 6  . fish oil-omega-3 fatty acids 1000 MG capsule Take 1 g by mouth daily.     . lansoprazole (PREVACID) 30 MG capsule Take 30 mg by mouth daily.      . nitroGLYCERIN (NITROSTAT) 0.4  MG SL tablet Place 1 tablet (0.4 mg total) under the tongue every 5 (five) minutes as needed for chest pain. 25 tablet 3  . potassium chloride (K-DUR) 10 MEQ tablet Take 4 tablets (40 mEq total) by mouth 2 (two) times daily. 240 tablet 6  . pravastatin (PRAVACHOL) 80 MG tablet Take 80 mg by mouth daily.   3  . torsemide (DEMADEX) 20 MG tablet Take 3 tablets (60 mg total) by mouth 2 (two) times daily. 180 tablet 6   No current facility-administered medications for this encounter.     Allergies:   Bee venom   Social History:  The patient  reports that he quit smoking about 41 years ago. His smoking use included cigarettes. He has never used smokeless tobacco. He reports current alcohol use of about 16.0 standard drinks of alcohol per week. He reports that he does not use drugs.   Family History:  The patient's family history includes Heart disease in his father; Hypertension in his father.   Recent Labs: 11/14/2018: Platelets 229 11/15/2018: B Natriuretic Peptide 1,479.4; TSH 3.125 11/16/2018: ALT 22; Hemoglobin 12.2 11/19/2018: BUN 43; Creatinine, Ser 1.36; Magnesium 2.0; Potassium 3.4; Sodium 133  Personally reviewed   Wt Readings from Last 3 Encounters:  11/20/18 86 kg (189 lb 9.6 oz)  11/19/18 86.4 kg (190 lb 7.6 oz)  11/14/18 83 kg (183 lb)    Vitals:   11/27/18 1027  Pulse: 93  SpO2: 99%   Exam:   General: Weak appearing. Arrived in wheelchair. HEENT: normal Neck: supple. JVP to jaw Carotids 2+ bilat; no bruits. No lymphadenopathy or thryomegaly appreciated. Cor: PMI nondisplaced. Regular rate & rhythm. No rubs, gallops or murmurs. Lungs: clear decreased in bases Abdomen: soft, nontender, nondistended. No hepatosplenomegaly. No bruits or masses. Good bowel sounds. Extremities: no cyanosis, clubbing, rash, 3+ edema Neuro: alert & orientedx3, cranial nerves grossly intact. moves all 4 extremities w/o difficulty. 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 EF 49% - Cardiac biopsy at [facility] 3/20 confirms amyloid but not large enough sample to send for mass spec - UPEP and Urine IFE show lambda light chains c/w AL amyloid - Seen by Dr. Irene Limbo and will begin therapy this week - NYHA IIIB-IV. Recent low output with low co-ox - Volume elevated ReDS 49% (may be due to pleural effusions as well). Add metolazone 57m M/W/F with extra KCL 40 with each dose - Overall very tenuous. It remains to be seen how well he will tolerate chemo with advanced HF. - Will enroll in [facility] program - Add midodrine 5 tid for low BP   2. Chronic hypoxic respiratory failure - continue home O2   3. Multiple Myeloma - Seen by Dr KIrene Limboand will begin therapy this week   4. CKD Stage III.  - Creatinine baseline 1.9.  - Repeat labs today. Watch closely with addition of metolazone  DGlori Bickers MD  11:06 AM   [phone]  [phone] (office) [phone] (fax)

## 2018-11-27 NOTE — Progress Notes (Signed)
ReDS Vest - 11/27/18 1045      ReDS Vest   MR   Moderate    Estimated volume prior to reading  Med    Fitting Posture  Sitting    Height Marker  Tall    Ruler Value  29    Center Strip  Aligned    ReDS Value  49    Anatomical Comments  station D

## 2018-11-27 NOTE — Patient Instructions (Addendum)
Lab work done today. We will notify you of any abnormal lab work. No news is good news.  Lab work will need be done again on Monday.  START Midodrine 5mg  (1 tab) three times daily.  START Metolazone 5mg  (1 tab) every Monday, Wednesday, and Friday. Please take an extra 60meq (4 tabs) of Potassium every Monday, Wednesday, and Friday with your Metolazone.   Please Follow up with Dr. Haroldine Laws in 2-3 weeks.  It was a Pleasure seeing you today!!

## 2018-11-27 NOTE — Addendum Note (Signed)
Encounter addended by: Marlise Eves, RN on: 11/27/2018 11:23 AM  Actions taken: Diagnosis association updated, Order list changed, Charge Capture section accepted, Clinical Note Signed

## 2018-11-28 ENCOUNTER — Inpatient Hospital Stay: Payer: PPO

## 2018-11-28 ENCOUNTER — Other Ambulatory Visit: Payer: Self-pay

## 2018-11-28 ENCOUNTER — Inpatient Hospital Stay: Payer: PPO | Attending: Hematology

## 2018-11-28 VITALS — BP 91/58 | HR 88 | Temp 98.5°F | Resp 22

## 2018-11-28 DIAGNOSIS — E781 Pure hyperglyceridemia: Secondary | ICD-10-CM | POA: Diagnosis not present

## 2018-11-28 DIAGNOSIS — J90 Pleural effusion, not elsewhere classified: Secondary | ICD-10-CM | POA: Insufficient documentation

## 2018-11-28 DIAGNOSIS — I130 Hypertensive heart and chronic kidney disease with heart failure and stage 1 through stage 4 chronic kidney disease, or unspecified chronic kidney disease: Secondary | ICD-10-CM | POA: Diagnosis not present

## 2018-11-28 DIAGNOSIS — E785 Hyperlipidemia, unspecified: Secondary | ICD-10-CM | POA: Diagnosis not present

## 2018-11-28 DIAGNOSIS — N179 Acute kidney failure, unspecified: Secondary | ICD-10-CM | POA: Insufficient documentation

## 2018-11-28 DIAGNOSIS — Z5111 Encounter for antineoplastic chemotherapy: Secondary | ICD-10-CM | POA: Insufficient documentation

## 2018-11-28 DIAGNOSIS — R0609 Other forms of dyspnea: Secondary | ICD-10-CM | POA: Insufficient documentation

## 2018-11-28 DIAGNOSIS — I700 Atherosclerosis of aorta: Secondary | ICD-10-CM | POA: Insufficient documentation

## 2018-11-28 DIAGNOSIS — K219 Gastro-esophageal reflux disease without esophagitis: Secondary | ICD-10-CM | POA: Insufficient documentation

## 2018-11-28 DIAGNOSIS — E854 Organ-limited amyloidosis: Secondary | ICD-10-CM | POA: Diagnosis not present

## 2018-11-28 DIAGNOSIS — C9000 Multiple myeloma not having achieved remission: Secondary | ICD-10-CM | POA: Insufficient documentation

## 2018-11-28 DIAGNOSIS — Z7189 Other specified counseling: Secondary | ICD-10-CM

## 2018-11-28 DIAGNOSIS — I43 Cardiomyopathy in diseases classified elsewhere: Secondary | ICD-10-CM | POA: Diagnosis not present

## 2018-11-28 DIAGNOSIS — I5032 Chronic diastolic (congestive) heart failure: Secondary | ICD-10-CM | POA: Insufficient documentation

## 2018-11-28 DIAGNOSIS — Z79899 Other long term (current) drug therapy: Secondary | ICD-10-CM | POA: Insufficient documentation

## 2018-11-28 DIAGNOSIS — I425 Other restrictive cardiomyopathy: Secondary | ICD-10-CM | POA: Insufficient documentation

## 2018-11-28 DIAGNOSIS — R945 Abnormal results of liver function studies: Secondary | ICD-10-CM | POA: Insufficient documentation

## 2018-11-28 DIAGNOSIS — Z87891 Personal history of nicotine dependence: Secondary | ICD-10-CM | POA: Diagnosis not present

## 2018-11-28 DIAGNOSIS — Z7982 Long term (current) use of aspirin: Secondary | ICD-10-CM | POA: Insufficient documentation

## 2018-11-28 DIAGNOSIS — M1990 Unspecified osteoarthritis, unspecified site: Secondary | ICD-10-CM | POA: Diagnosis not present

## 2018-11-28 DIAGNOSIS — E876 Hypokalemia: Secondary | ICD-10-CM | POA: Diagnosis not present

## 2018-11-28 DIAGNOSIS — E8581 Light chain (AL) amyloidosis: Secondary | ICD-10-CM

## 2018-11-28 LAB — CBC WITH DIFFERENTIAL/PLATELET
Abs Immature Granulocytes: 0.03 10*3/uL (ref 0.00–0.07)
Basophils Absolute: 0 10*3/uL (ref 0.0–0.1)
Basophils Relative: 0 %
Eosinophils Absolute: 0.1 10*3/uL (ref 0.0–0.5)
Eosinophils Relative: 1 %
HCT: 35.8 % — ABNORMAL LOW (ref 39.0–52.0)
Hemoglobin: 11.8 g/dL — ABNORMAL LOW (ref 13.0–17.0)
Immature Granulocytes: 0 %
Lymphocytes Relative: 10 %
Lymphs Abs: 1.2 10*3/uL (ref 0.7–4.0)
MCH: 33 pg (ref 26.0–34.0)
MCHC: 33 g/dL (ref 30.0–36.0)
MCV: 100 fL (ref 80.0–100.0)
Monocytes Absolute: 0.9 10*3/uL (ref 0.1–1.0)
Monocytes Relative: 7 %
Neutro Abs: 9.8 10*3/uL — ABNORMAL HIGH (ref 1.7–7.7)
Neutrophils Relative %: 82 %
Platelets: 273 10*3/uL (ref 150–400)
RBC: 3.58 MIL/uL — ABNORMAL LOW (ref 4.22–5.81)
RDW: 12.6 % (ref 11.5–15.5)
WBC: 12 10*3/uL — ABNORMAL HIGH (ref 4.0–10.5)
nRBC: 0 % (ref 0.0–0.2)

## 2018-11-28 LAB — CMP (CANCER CENTER ONLY)
ALT: 32 U/L (ref 0–44)
AST: 28 U/L (ref 15–41)
Albumin: 3.1 g/dL — ABNORMAL LOW (ref 3.5–5.0)
Alkaline Phosphatase: 112 U/L (ref 38–126)
Anion gap: 14 (ref 5–15)
BUN: 64 mg/dL — ABNORMAL HIGH (ref 8–23)
CO2: 28 mmol/L (ref 22–32)
Calcium: 9.1 mg/dL (ref 8.9–10.3)
Chloride: 96 mmol/L — ABNORMAL LOW (ref 98–111)
Creatinine: 2.11 mg/dL — ABNORMAL HIGH (ref 0.61–1.24)
GFR, Est AFR Am: 35 mL/min — ABNORMAL LOW (ref >60)
GFR, Estimated: 30 mL/min — ABNORMAL LOW (ref >60)
Glucose, Bld: 146 mg/dL — ABNORMAL HIGH (ref 70–99)
Potassium: 4.6 mmol/L (ref 3.5–5.1)
Sodium: 138 mmol/L (ref 135–145)
Total Bilirubin: 0.8 mg/dL (ref 0.3–1.2)
Total Protein: 6.3 g/dL — ABNORMAL LOW (ref 6.5–8.1)

## 2018-11-28 MED ORDER — PALONOSETRON HCL INJECTION 0.25 MG/5ML
INTRAVENOUS | Status: AC
  Filled 2018-11-28: qty 5

## 2018-11-28 MED ORDER — PROCHLORPERAZINE MALEATE 10 MG PO TABS: 10.0000 mg | ORAL_TABLET | Freq: Four times a day (QID) | ORAL | 1 refills | Status: AC | PRN

## 2018-11-28 MED ORDER — ACYCLOVIR 400 MG PO TABS: 400.0000 mg | ORAL_TABLET | Freq: Every day | ORAL | 3 refills | Status: AC

## 2018-11-28 MED ORDER — ONDANSETRON HCL 8 MG PO TABS: 8.0000 mg | ORAL_TABLET | Freq: Two times a day (BID) | ORAL | 1 refills | Status: AC | PRN

## 2018-11-28 MED ORDER — DEXAMETHASONE 4 MG PO TABS
12.0000 mg | ORAL_TABLET | Freq: Once | ORAL | Status: AC
  Administered 2018-11-28: 12 mg via ORAL

## 2018-11-28 MED ORDER — PALONOSETRON HCL INJECTION 0.25 MG/5ML
0.2500 mg | Freq: Once | INTRAVENOUS | Status: AC
  Administered 2018-11-28: 0.25 mg via INTRAVENOUS

## 2018-11-28 MED ORDER — SODIUM CHLORIDE 0.9 % IV SOLN
500.0000 mg | Freq: Once | INTRAVENOUS | Status: AC
  Administered 2018-11-28: 500 mg via INTRAVENOUS
  Filled 2018-11-28: qty 25

## 2018-11-28 MED ORDER — BORTEZOMIB CHEMO SQ INJECTION 3.5 MG (2.5MG/ML)
1.5000 mg/m2 | Freq: Once | INTRAMUSCULAR | Status: AC
  Administered 2018-11-28: 3 mg via SUBCUTANEOUS
  Filled 2018-11-28: qty 1.2

## 2018-11-28 MED ORDER — SODIUM CHLORIDE 0.9 % IV SOLN
Freq: Once | INTRAVENOUS | Status: AC
  Administered 2018-11-28: 11:00:00 via INTRAVENOUS
  Filled 2018-11-28: qty 250

## 2018-11-28 MED ORDER — DEXAMETHASONE 4 MG PO TABS
ORAL_TABLET | ORAL | Status: AC
  Filled 2018-11-28: qty 3

## 2018-11-28 NOTE — Progress Notes (Signed)
Per Dr. Irene Limbo ok to treat with Creatinine 2.11

## 2018-11-28 NOTE — Patient Instructions (Signed)
[facility] Discharge Instructions for Patients Receiving Chemotherapy  Today you received the following chemotherapy agents Cytoxan and Velcade  To help prevent nausea and vomiting after your treatment, we encourage you to take your nausea medication as prescribed by MD. **DO NOT TAKE ZOFRAN FOR 3 DAYS AFTER TREATMENT. TAKE COMPAZINE IF YOU EXPERIENCE ANY NAUSEA**   If you develop nausea and vomiting that is not controlled by your nausea medication, call the clinic.   BELOW ARE SYMPTOMS THAT SHOULD BE REPORTED IMMEDIATELY:  *FEVER GREATER THAN 100.5 F  *CHILLS WITH OR WITHOUT FEVER  NAUSEA AND VOMITING THAT IS NOT CONTROLLED WITH YOUR NAUSEA MEDICATION  *UNUSUAL SHORTNESS OF BREATH  *UNUSUAL BRUISING OR BLEEDING  TENDERNESS IN MOUTH AND THROAT WITH OR WITHOUT PRESENCE OF ULCERS  *URINARY PROBLEMS  *BOWEL PROBLEMS  UNUSUAL RASH Items with * indicate a potential emergency and should be followed up as soon as possible.  Feel free to call the clinic should you have any questions or concerns. The clinic phone number is [phone].  Please show the [facility] at check-in to the Emergency Department and triage nurse.  Bortezomib injection What is this medicine? BORTEZOMIB (bor TEZ oh mib) is a medicine that targets proteins in cancer cells and stops the cancer cells from growing. It is used to treat multiple myeloma and mantle-cell lymphoma. This medicine may be used for other purposes; ask your health care provider or pharmacist if you have questions. COMMON BRAND NAME(S): Velcade What should I tell my health care provider before I take this medicine? They need to know if you have any of these conditions: -diabetes -heart disease -irregular heartbeat -liver disease -on hemodialysis -low blood counts, like low white blood cells, platelets, or hemoglobin -peripheral neuropathy -taking medicine for blood pressure -an unusual or allergic reaction to  bortezomib, mannitol, boron, other medicines, foods, dyes, or preservatives -pregnant or trying to get pregnant -breast-feeding How should I use this medicine? This medicine is for injection into a vein or for injection under the skin. It is given by a health care professional in a hospital or clinic setting. Talk to your pediatrician regarding the use of this medicine in children. Special care may be needed. Overdosage: If you think you have taken too much of this medicine contact a poison control center or emergency room at once. NOTE: This medicine is only for you. Do not share this medicine with others. What if I miss a dose? It is important not to miss your dose. Call your doctor or health care professional if you are unable to keep an appointment. What may interact with this medicine? This medicine may interact with the following medications: -ketoconazole -rifampin -ritonavir -St. John's Wort This list may not describe all possible interactions. Give your health care provider a list of all the medicines, herbs, non-prescription drugs, or dietary supplements you use. Also tell them if you smoke, drink alcohol, or use illegal drugs. Some items may interact with your medicine. What should I watch for while using this medicine? You may get drowsy or dizzy. Do not drive, use machinery, or do anything that needs mental alertness until you know how this medicine affects you. Do not stand or sit up quickly, especially if you are an older patient. This reduces the risk of dizzy or fainting spells. In some cases, you may be given additional medicines to help with side effects. Follow all directions for their use. Call your doctor or health care professional for advice  if you get a fever, chills or sore throat, or other symptoms of a cold or flu. Do not treat yourself. This drug decreases your body's ability to fight infections. Try to avoid being around people who are sick. This medicine may  increase your risk to bruise or bleed. Call your doctor or health care professional if you notice any unusual bleeding. You may need blood work done while you are taking this medicine. In some patients, this medicine may cause a serious brain infection that may cause death. If you have any problems seeing, thinking, speaking, walking, or standing, tell your doctor right away. If you cannot reach your doctor, urgently seek other source of medical care. Check with your doctor or health care professional if you get an attack of severe diarrhea, nausea and vomiting, or if you sweat a lot. The loss of too much body fluid can make it dangerous for you to take this medicine. Do not become pregnant while taking this medicine or for at least 7 months after stopping it. Women should inform their doctor if they wish to become pregnant or think they might be pregnant. Men should not father a child while taking this medicine and for at least 4 months after stopping it. There is a potential for serious side effects to an unborn child. Talk to your health care professional or pharmacist for more information. Do not breast-feed an infant while taking this medicine or for 2 months after stopping it. This medicine may interfere with the ability to have a child. You should talk with your doctor or health care professional if you are concerned about your fertility. What side effects may I notice from receiving this medicine? Side effects that you should report to your doctor or health care professional as soon as possible: -allergic reactions like skin rash, itching or hives, swelling of the face, lips, or tongue -breathing problems -changes in hearing -changes in vision -fast, irregular heartbeat -feeling faint or lightheaded, falls -pain, tingling, numbness in the hands or feet -right upper belly pain -seizures -swelling of the ankles, feet, hands -unusual bleeding or bruising -unusually weak or  tired -vomiting -yellowing of the eyes or skin Side effects that usually do not require medical attention (report to your doctor or health care professional if they continue or are bothersome): -changes in emotions or moods -constipation -diarrhea -loss of appetite -headache -irritation at site where injected -nausea This list may not describe all possible side effects. Call your doctor for medical advice about side effects. You may report side effects to FDA at 1-800-FDA-1088. Where should I keep my medicine? This drug is given in a hospital or clinic and will not be stored at home. NOTE: This sheet is a summary. It may not cover all possible information. If you have questions about this medicine, talk to your doctor, pharmacist, or health care provider.  2019 Elsevier/Gold Standard (2017-10-21 16:29:31)    Cyclophosphamide injection What is this medicine? CYCLOPHOSPHAMIDE (sye kloe FOSS fa mide) is a chemotherapy drug. It slows the growth of cancer cells. This medicine is used to treat many types of cancer like lymphoma, myeloma, leukemia, breast cancer, and ovarian cancer, to name a few. This medicine may be used for other purposes; ask your health care provider or pharmacist if you have questions. COMMON BRAND NAME(S): Cytoxan, Neosar What should I tell my health care provider before I take this medicine? They need to know if you have any of these conditions: -blood disorders -history of other  chemotherapy -infection -kidney disease -liver disease -recent or ongoing radiation therapy -tumors in the bone marrow -an unusual or allergic reaction to cyclophosphamide, other chemotherapy, other medicines, foods, dyes, or preservatives -pregnant or trying to get pregnant -breast-feeding How should I use this medicine? This drug is usually given as an injection into a vein or muscle or by infusion into a vein. It is administered in a hospital or clinic by a specially trained health  care professional. Talk to your pediatrician regarding the use of this medicine in children. Special care may be needed. Overdosage: If you think you have taken too much of this medicine contact a poison control center or emergency room at once. NOTE: This medicine is only for you. Do not share this medicine with others. What if I miss a dose? It is important not to miss your dose. Call your doctor or health care professional if you are unable to keep an appointment. What may interact with this medicine? This medicine may interact with the following medications: -amiodarone -amphotericin B -azathioprine -certain antiviral medicines for HIV or AIDS such as protease inhibitors (e.g., indinavir, ritonavir) and zidovudine -certain blood pressure medications such as benazepril, captopril, enalapril, fosinopril, lisinopril, moexipril, monopril, perindopril, quinapril, ramipril, trandolapril -certain cancer medications such as anthracyclines (e.g., daunorubicin, doxorubicin), busulfan, cytarabine, paclitaxel, pentostatin, tamoxifen, trastuzumab -certain diuretics such as chlorothiazide, chlorthalidone, hydrochlorothiazide, indapamide, metolazone -certain medicines that treat or prevent blood clots like warfarin -certain muscle relaxants such as succinylcholine -cyclosporine -etanercept -indomethacin -medicines to increase blood counts like filgrastim, pegfilgrastim, sargramostim -medicines used as general anesthesia -metronidazole -natalizumab This list may not describe all possible interactions. Give your health care provider a list of all the medicines, herbs, non-prescription drugs, or dietary supplements you use. Also tell them if you smoke, drink alcohol, or use illegal drugs. Some items may interact with your medicine. What should I watch for while using this medicine? Visit your doctor for checks on your progress. This drug may make you feel generally unwell. This is not uncommon, as  chemotherapy can affect healthy cells as well as cancer cells. Report any side effects. Continue your course of treatment even though you feel ill unless your doctor tells you to stop. Drink water or other fluids as directed. Urinate often, even at night. In some cases, you may be given additional medicines to help with side effects. Follow all directions for their use. Call your doctor or health care professional for advice if you get a fever, chills or sore throat, or other symptoms of a cold or flu. Do not treat yourself. This drug decreases your body's ability to fight infections. Try to avoid being around people who are sick. This medicine may increase your risk to bruise or bleed. Call your doctor or health care professional if you notice any unusual bleeding. Be careful brushing and flossing your teeth or using a toothpick because you may get an infection or bleed more easily. If you have any dental work done, tell your dentist you are receiving this medicine. You may get drowsy or dizzy. Do not drive, use machinery, or do anything that needs mental alertness until you know how this medicine affects you. Do not become pregnant while taking this medicine or for 1 year after stopping it. Women should inform their doctor if they wish to become pregnant or think they might be pregnant. Men should not father a child while taking this medicine and for 4 months after stopping it. There is a potential for serious side  effects to an unborn child. Talk to your health care professional or pharmacist for more information. Do not breast-feed an infant while taking this medicine. This medicine may interfere with the ability to have a child. This medicine has caused ovarian failure in some women. This medicine has caused reduced sperm counts in some men. You should talk with your doctor or health care professional if you are concerned about your fertility. If you are going to have surgery, tell your doctor or  health care professional that you have taken this medicine. What side effects may I notice from receiving this medicine? Side effects that you should report to your doctor or health care professional as soon as possible: -allergic reactions like skin rash, itching or hives, swelling of the face, lips, or tongue -low blood counts - this medicine may decrease the number of white blood cells, red blood cells and platelets. You may be at increased risk for infections and bleeding. -signs of infection - fever or chills, cough, sore throat, pain or difficulty passing urine -signs of decreased platelets or bleeding - bruising, pinpoint red spots on the skin, black, tarry stools, blood in the urine -signs of decreased red blood cells - unusually weak or tired, fainting spells, lightheadedness -breathing problems -dark urine -dizziness -palpitations -swelling of the ankles, feet, hands -trouble passing urine or change in the amount of urine -weight gain -yellowing of the eyes or skin Side effects that usually do not require medical attention (report to your doctor or health care professional if they continue or are bothersome): -changes in nail or skin color -hair loss -missed menstrual periods -mouth sores -nausea, vomiting This list may not describe all possible side effects. Call your doctor for medical advice about side effects. You may report side effects to FDA at 1-800-FDA-1088. Where should I keep my medicine? This drug is given in a hospital or clinic and will not be stored at home. NOTE: This sheet is a summary. It may not cover all possible information. If you have questions about this medicine, talk to your doctor, pharmacist, or health care provider.  2019 Elsevier/Gold Standard (2012-04-25 16:22:58)

## 2018-11-29 DIAGNOSIS — I5020 Unspecified systolic (congestive) heart failure: Secondary | ICD-10-CM | POA: Diagnosis not present

## 2018-11-29 DIAGNOSIS — M1991 Primary osteoarthritis, unspecified site: Secondary | ICD-10-CM | POA: Diagnosis not present

## 2018-11-29 DIAGNOSIS — Z87891 Personal history of nicotine dependence: Secondary | ICD-10-CM | POA: Diagnosis not present

## 2018-11-29 DIAGNOSIS — I5033 Acute on chronic diastolic (congestive) heart failure: Secondary | ICD-10-CM | POA: Diagnosis not present

## 2018-11-29 DIAGNOSIS — I130 Hypertensive heart and chronic kidney disease with heart failure and stage 1 through stage 4 chronic kidney disease, or unspecified chronic kidney disease: Secondary | ICD-10-CM | POA: Diagnosis not present

## 2018-11-29 DIAGNOSIS — C9000 Multiple myeloma not having achieved remission: Secondary | ICD-10-CM | POA: Diagnosis not present

## 2018-11-29 DIAGNOSIS — Z9981 Dependence on supplemental oxygen: Secondary | ICD-10-CM | POA: Diagnosis not present

## 2018-11-29 DIAGNOSIS — J9611 Chronic respiratory failure with hypoxia: Secondary | ICD-10-CM | POA: Diagnosis not present

## 2018-11-29 DIAGNOSIS — I425 Other restrictive cardiomyopathy: Secondary | ICD-10-CM | POA: Diagnosis not present

## 2018-11-29 DIAGNOSIS — K219 Gastro-esophageal reflux disease without esophagitis: Secondary | ICD-10-CM | POA: Diagnosis not present

## 2018-11-29 DIAGNOSIS — N183 Chronic kidney disease, stage 3 (moderate): Secondary | ICD-10-CM | POA: Diagnosis not present

## 2018-11-29 DIAGNOSIS — E782 Mixed hyperlipidemia: Secondary | ICD-10-CM | POA: Diagnosis not present

## 2018-11-30 ENCOUNTER — Telehealth: Payer: Self-pay | Admitting: Physician Assistant

## 2018-11-30 NOTE — Telephone Encounter (Signed)
Patient had first round of chemo Friday.  He is using 4 L of oxygen 24/7.  He has chronic lower extremity edema and dyspnea.  He came 4 pounds overnight.  He takes torsemide 60 mg twice daily.  Advised to take 20 mg of torsemide with potassium 10 mEq this afternoon. Continue to take regular dose.   Will call us tomorrow if still gaining weight.

## 2018-12-01 ENCOUNTER — Ambulatory Visit (HOSPITAL_COMMUNITY)
Admission: RE | Admit: 2018-12-01 | Discharge: 2018-12-01 | Disposition: A | Discharge status: Home / Self Care | Payer: PPO | Source: Ambulatory Visit | Attending: Cardiology | Admitting: Cardiology

## 2018-12-01 ENCOUNTER — Other Ambulatory Visit: Payer: Self-pay

## 2018-12-01 ENCOUNTER — Telehealth: Payer: Self-pay | Admitting: *Deleted

## 2018-12-01 ENCOUNTER — Other Ambulatory Visit: Payer: Self-pay | Admitting: Hematology

## 2018-12-01 DIAGNOSIS — I5022 Chronic systolic (congestive) heart failure: Secondary | ICD-10-CM | POA: Diagnosis not present

## 2018-12-01 DIAGNOSIS — C9000 Multiple myeloma not having achieved remission: Secondary | ICD-10-CM

## 2018-12-01 DIAGNOSIS — E8581 Light chain (AL) amyloidosis: Secondary | ICD-10-CM

## 2018-12-01 LAB — BRAIN NATRIURETIC PEPTIDE: B Natriuretic Peptide: >4500 pg/mL — ABNORMAL HIGH (ref 0.0–100.0)

## 2018-12-01 LAB — KAPPA/LAMBDA LIGHT CHAINS
Kappa free light chain: 14.8 mg/L (ref 3.3–19.4)
Kappa, lambda light chain ratio: 0 — ABNORMAL LOW (ref 0.26–1.65)
Lambda free light chains: 4100.9 mg/L — ABNORMAL HIGH (ref 5.7–26.3)

## 2018-12-01 LAB — BASIC METABOLIC PANEL
Anion gap: 18 — ABNORMAL HIGH (ref 5–15)
BUN: 97 mg/dL — ABNORMAL HIGH (ref 8–23)
CO2: 25 mmol/L (ref 22–32)
Calcium: 8.9 mg/dL (ref 8.9–10.3)
Chloride: 90 mmol/L — ABNORMAL LOW (ref 98–111)
Creatinine, Ser: 2.96 mg/dL — ABNORMAL HIGH (ref 0.61–1.24)
GFR calc Af Amer: 23 mL/min — ABNORMAL LOW (ref >60)
GFR calc non Af Amer: 20 mL/min — ABNORMAL LOW (ref >60)
Glucose, Bld: 161 mg/dL — ABNORMAL HIGH (ref 70–99)
Potassium: 4.5 mmol/L (ref 3.5–5.1)
Sodium: 133 mmol/L — ABNORMAL LOW (ref 135–145)

## 2018-12-01 NOTE — Telephone Encounter (Signed)
-----   Message from Shelda Altes, RN sent at 11/28/2018 12:07 PM EDT ----- Please call patient re: 1st time cytoxan and velcade on 11/28/2018. He is a patient of Dr. Irene Limbo.  Thanks, Jarrett Soho

## 2018-12-01 NOTE — Telephone Encounter (Signed)
[address] Adam Lewis [phone]).  Message left requesting a return call for chemotherapy follow up.  Awaiting return call from patient.

## 2018-12-01 NOTE — Telephone Encounter (Signed)
[place] for chemotherapy F/U.  Patient is doing well.  Denies n/v.  Denies any new side effects or symptoms.  Bowel and bladder functioning well.  Eating and drinking well.  Instructed to drink 64 oz minimum daily or at least the day before, of and after treatment.   Asked about appointment for port-a-cath.  Provided radiology number to request IR who called him 11-28-2018.  Denies further questions or needs at this time.  Encouraged to call [phone] Mon -Fri 8:00 am - 4:30 pm or anytime as needed for symptoms, changes or event outside office hours.

## 2018-12-02 ENCOUNTER — Encounter (HOSPITAL_COMMUNITY): Payer: PPO

## 2018-12-03 NOTE — Progress Notes (Signed)
HEMATOLOGY/ONCOLOGY CLINIC NOTE  Date of Service: 12/04/2018  Patient Care Team: Redmond School, MD as PCP - General (Internal Medicine) Satira Sark, MD as PCP - Cardiology (Cardiology) Bensimhon, Shaune Pascal, MD as PCP - Advanced Heart Failure (Cardiology)  CHIEF COMPLAINTS/PURPOSE OF CONSULTATION:  Plasma Cell Dyscrasia  HISTORY OF PRESENTING ILLNESS:  Adam Lewis is a wonderful 73 y.o. 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 He is currently admitted with decompensated heart failure.  Patient has a history of hypertension, chronic diastolic heart failure with restrictive cardiomyopathy with dyspnea on exertion, GERD, dyslipidemia and osteoarthritis who reports progressively worsening dyspnea on exertion and shortness of breath over the last 6 to 8 months. Patient has been following up with cardiology. Echocardiogram done on 01/2018 showed EF 55-60% with moderate LVH restrictive physiology, RVSP 38 mmHg.  He subsequently had cardiac catheterization in August 2019 which showed normal coronaries. Cardiac MRI done on 08/14/2018 showed1. Septal hypertrophy normal LV size mild diffuse hypokinesis EF 49% 2. Markedly abnormal delayed gadolinium images with severe diffuse myocardial uptake suggesting severe infiltrative cardiomyopathy. Consider biopsy and genetic testing  Patient was subsequently referred to [facility] and had an endomyocardial biopsy on 09/04/2018 which showed  Endomyocardial biopsy; diagnostic biopsy:  Cardiac amyloidosis, mild infiltrative and nodular pattern - See comment Mild myocyte hypertrophy with mild to moderate lipofuscinosis No evidence of myocarditis or granulomatous inflammation Comment:  The biopsy tissue demonstrates several foci of myocyte dropout with replacement by a pale waxy material that suggests amyloid but has some atypical  staining. This likely reflects preservation/processing issue.  Deeper sections no longer contain these foci, but some additional areas with similar interstitial material  are present.  The section for Congo Red stain does not contain any of these foci; however, there is at least on focus of slight interstitial expansion by Congophilic material that exhibits typical birefringence under polarization.  Electron microscopic confirmation will be attempted and expedited. Additionally, the remaining tissue block will be sent for amyloid mass spectrometry. The case has been reviewed with Dr. Tonye Royalty, also of cardiovascular pathology, who is in agreement with the diagnosis.  Apparently cardiac endomyocardial biopsy sample was inadequate for more spectrometric analysis to determine the type of amyloid.  Lab testing on 09/25/2018 for serum free light chains showed significantly elevated lambda free light chains at 2293.1 with normal kappa free light chains with significantly normal ratio. Urine random immunofixation electrophoresis showed Bence-Jones protein lambda type.  Patient subsequently had a CT-guided bone marrow biopsy on 10/24/2018 which showed evidence of plasma cell myeloma with lambda restricted plasma cells at 17% by aspirate and 30 to 40% based on CD 138 immunohistochemistry. Congo red was negative for amyloid on the bone marrow biopsy.  Patient is currently admitted decompensation of his restrictive cardiomyopathy due to amyloidosis with difficult to manage volume status and acute kidney injury. He is currently on a Levophed and Lasix drip with good diuresis and notes that his shortness of breath is improved.  He notes that until summer of last year he was very active and walked his pet regularly several times a day.  Since fall of last year he has noted worsening shortness of breath and dyspnea on exertion. Which is progressively gotten worse.  He just had a discussion with Dr. Haroldine Laws  regarding the concerning prognosis from a cardiology standpoint.  He is concerned about what this means  for his wife and family.  We spent a significant bit of time talking about his diagnosis of plasma cell dyscrasia with AL amyloidosis and additional diagnostics including a 24-hour urine, labs, outpatient PET CT scan to rule out bone lesions and natural history and treatment approaches.  He realizes in the current focus of treatment is to get his heart failure under better control.  Interval History:   Adam Lewis returns today for management and evaluation of his Plasma Cell Dyscrasia. The patient's last visit with [place] was on 11/20/18. The pt reports that he is doing well overall.  The pt reports that he tolerated C1D1 CyBorD well overall, but endorses loss of taste but denies mouth sores. He notes that he has been urinating very frequently on his diuretics and lost 8 pounds since two weeks ago. He endorses bilateral ankle swelling. He notes that he is eating fair overall and is using Ensure to supplement. The pt denies nausea or vomiting but notes he is "a little bit on the diarrhea side." He has hemorrhoids and takes Metamucil in the morning which he believes was too effective. He denies diarrhea as such however.  The pt notes that he feels he is "not getting as winded as easily." He notes that he has been able to get up and walk to the bathroom at home. He has at home nursing care currently. He is on continuous O2 and is staying around 98% saturation.  Lab results today (12/04/18) of CBC w/diff is as follows: all values are WNL except for WBC at 13.2k, RBC at 3.76, HGB at 12.3, HCT at 36.4, nRBC at 0.5%, ANC at 11.3k. 12/04/18 CMP noted. On review of systems, pt reports eating fair, loss of taste, moving his bowels well, urinating frequently, breathing better, bilateral ankle swelling, and denies mouth sores, nausea, vomiting, diarrhea, and any other symptoms.     MEDICAL HISTORY:    Past Medical History:  Diagnosis Date   Essential hypertension    Family history of adverse reaction to anesthesia    father died after OHS in 74, unaware of reason   GERD (gastroesophageal reflux disease)    Hypertriglyceridemia    Osteoarthritis     SURGICAL HISTORY: Past Surgical History:  Procedure Laterality Date   CARPAL TUNNEL RELEASE     COLONOSCOPY  01/02/2011   Procedure: COLONOSCOPY;  Surgeon: Jamesetta So;  Location: [facility];  Service: Gastroenterology;  Laterality: N/A;   LEFT HEART CATH AND CORONARY ANGIOGRAPHY N/A 02/18/2018   Procedure: LEFT HEART CATH AND CORONARY ANGIOGRAPHY;  Surgeon: Troy Sine, MD;  Location: [place] [facility];  Service: Cardiovascular;  Laterality: N/A;   LITHOTRIPSY  05/2009   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    SOCIAL HISTORY: Social History   Socioeconomic History   Marital status: Married    Spouse name: Not on file   Number of children: Not on file   Years of education: Not on file   Highest education level: Not on file  Occupational History   Not on file  Social Needs   Financial resource strain: Not on file   Food insecurity    Worry: Not on file    Inability: Not on file   Transportation needs    Medical: Not on file    Non-medical: Not on file  Tobacco Use   Smoking status: Former Smoker    Types: Cigarettes    Quit date: 07/16/1977    Years since  quitting: 41.4   Smokeless tobacco: Never Used  Substance and Sexual Activity   Alcohol use: Yes    Alcohol/week: 16.0 standard drinks    Types: 1 Glasses of wine, 14 Cans of beer, 1 Shots of liquor per week   Drug use: No   Sexual activity: Not on file  Lifestyle   Physical activity    Days per week: Not on file    Minutes per session: Not on file   Stress: Not on file  Relationships   Social connections    Talks on phone: Not on file    Gets together: Not on file    Attends religious service: Not on  file    Active member of club or organization: Not on file    Attends meetings of clubs or organizations: Not on file    Relationship status: Not on file   Intimate partner violence    Fear of current or ex partner: Not on file    Emotionally abused: Not on file    Physically abused: Not on file    Forced sexual activity: Not on file  Other Topics Concern   Not on file  Social History Narrative   Not on file    FAMILY HISTORY: Family History  Problem Relation Age of Onset   Hypertension Father    Heart disease Father     ALLERGIES:  is allergic to bee venom.  MEDICATIONS:  Current Outpatient Medications  Medication Sig Dispense Refill   acyclovir (ZOVIRAX) 400 MG tablet Take 1 tablet (400 mg total) by mouth daily. 60 tablet 3   allopurinol (ZYLOPRIM) 100 MG tablet Take 100 mg by mouth daily.   3   ALPRAZolam (XANAX) 0.5 MG tablet Take 0.5 mg by mouth 4 (four) times daily as needed for anxiety.     aspirin 81 MG chewable tablet Chew 1 tablet (81 mg total) by mouth daily. 30 tablet 6   escitalopram (LEXAPRO) 10 MG tablet Take 10 mg by mouth daily.     fish oil-omega-3 fatty acids 1000 MG capsule Take 1 g by mouth daily.      lansoprazole (PREVACID) 30 MG capsule Take 30 mg by mouth daily.       metolazone (ZAROXOLYN) 5 MG tablet Take 1 tablet (5 mg total) by mouth every Monday, Wednesday, and Friday. 15 tablet 3   midodrine (PROAMATINE) 5 MG tablet Take 1 tablet (5 mg total) by mouth 3 (three) times daily with meals. 270 tablet 3   ondansetron (ZOFRAN) 8 MG tablet Take 1 tablet (8 mg total) by mouth 2 (two) times daily as needed for refractory nausea / vomiting. Starting on days 4 and 11. Do not take on day 8. 30 tablet 1   potassium chloride (K-DUR) 10 MEQ tablet Take 4 tablets (40 mEq total) by mouth 2 (two) times daily. Take extra 25mq with metolazone. 360 tablet 6   pravastatin (PRAVACHOL) 80 MG tablet Take 80 mg by mouth daily.   3   prochlorperazine  (COMPAZINE) 10 MG tablet Take 1 tablet (10 mg total) by mouth every 6 (six) hours as needed (Nausea or vomiting). 30 tablet 1   torsemide (DEMADEX) 20 MG tablet Take 3 tablets (60 mg total) by mouth 2 (two) times daily. 180 tablet 6   nitroGLYCERIN (NITROSTAT) 0.4 MG SL tablet Place 1 tablet (0.4 mg total) under the tongue every 5 (five) minutes as needed for chest pain. (Patient not taking: Reported on 12/04/2018) 25 tablet 3  No current facility-administered medications for this visit.     REVIEW OF SYSTEMS:    A 10+ POINT REVIEW OF SYSTEMS WAS OBTAINED including neurology, dermatology, psychiatry, cardiac, respiratory, lymph, extremities, GI, GU, Musculoskeletal, constitutional, breasts, reproductive, HEENT.  All pertinent positives are noted in the HPI.  All others are negative.   PHYSICAL EXAMINATION: ECOG PERFORMANCE STATUS: 2 - Symptomatic, <50% confined to bed  . Vitals:   12/04/18 0909 12/04/18 0910  BP: (!) 85/59 (!) 84/63  Pulse: 84   Resp: 18   Temp: 98.2 F (36.8 C)   SpO2: 98%    Filed Weights   12/04/18 0909  Weight: 181 lb (82.1 kg)   .Body mass index is 26.73 kg/m.  GENERAL:alert, in no acute distress and comfortable, on O2 SKIN: no acute rashes, no significant lesions EYES: conjunctiva are pink and non-injected, sclera anicteric OROPHARYNX: MMM, no exudates, no oropharyngeal erythema or ulceration NECK: supple, no JVD LYMPH:  no palpable lymphadenopathy in the cervical, axillary or inguinal regions LUNGS: clear to auscultation b/l with normal respiratory effort HEART: S3 gallop ABDOMEN:  normoactive bowel sounds , non tender, not distended. No palpable hepatosplenomegaly.  Extremity: 1+ pedal edema PSYCH: alert & oriented x 3 with fluent speech NEURO: no focal motor/sensory deficits   LABORATORY DATA:  I have reviewed the data as listed  . CBC Latest Ref Rng & Units 12/04/2018 11/28/2018 11/16/2018  WBC 4.0 - 10.5 K/uL 13.2(H) 12.0(H) -  Hemoglobin  13.0 - 17.0 g/dL 12.3(L) 11.8(L) 12.2(L)  Hematocrit 39.0 - 52.0 % 36.4(L) 35.8(L) 36.0(L)  Platelets 150 - 400 K/uL 260 273 -    . CMP Latest Ref Rng & Units 12/04/2018 12/01/2018 11/28/2018  Glucose 70 - 99 mg/dL 187(H) 161(H) 146(H)  BUN 8 - 23 mg/dL 76(H) 97(H) 64(H)  Creatinine 0.61 - 1.24 mg/dL 2.27(H) 2.96(H) 2.11(H)  Sodium 135 - 145 mmol/L 136 133(L) 138  Potassium 3.5 - 5.1 mmol/L 3.5 4.5 4.6  Chloride 98 - 111 mmol/L 88(L) 90(L) 96(L)  CO2 22 - 32 mmol/L 33(H) 25 28  Calcium 8.9 - 10.3 mg/dL 8.9 8.9 9.1  Total Protein 6.5 - 8.1 g/dL 6.3(L) - 6.3(L)  Total Bilirubin 0.3 - 1.2 mg/dL 0.8 - 0.8  Alkaline Phos 38 - 126 U/L 222(H) - 112  AST 15 - 41 U/L 45(H) - 28  ALT 0 - 44 U/L 110(H) - 32    10/24/18 BM Bx:        RADIOGRAPHIC STUDIES: I have personally reviewed the radiological images as listed and agreed with the findings in the report. Dg Chest 2 View  Result Date: 11/14/2018 CLINICAL DATA:  Hypotension EXAM: CHEST - 2 VIEW COMPARISON:  Chest x-ray dated 07/30/2018 FINDINGS: The cardiac silhouette is mildly enlarged. There are new small bilateral pleural effusions, right worse than left. There are bibasilar airspace opacities favored to represent compressive atelectasis. There is generalized volume overload. No pneumothorax. The pulmonary arteries appears somewhat dilated which can be seen in patients with elevated PA pressures. There is no acute osseous abnormality. IMPRESSION: 1. Small bilateral pleural effusions, right worse than left. These have increased significantly since prior chest x-ray. 2. Borderline enlarged heart.  Generalized volume overload is noted. 3. Bibasilar airspace opacities favored to represent compressive atelectasis. Electronically Signed   By: Constance Holster M.D.   On: 11/14/2018 18:35   [place] Ekg Site Rite  Result Date: 11/16/2018 If Site Rite image not attached, placement could not be confirmed due to current cardiac rhythm.  ASSESSMENT &  PLAN:  73 y.o. 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 Given clinical presentation, time course , cardiac MRI findings and most importantly concurrent findings of Multiple myeloma with Lambda light chain restriction - the diagnosis of AL Amyloidosis would be reasonably made Standard risk genetics.  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 Mol Cy - t(11;14) -- standard risk cytogenetics No anemia, hypercalcemia. ? Renal involvement ? Bone lesions.  07/22/18 MMP did not reveal an M spike 11/18/18 SFLC revealed Kappa light chains at 13.63m and Lambda light chains at 3084.810m5/26/20 24-hour UPEP revealed 7.225g of total protein per day and 5.208g of Lambda light chains per day, and 18.6562mf Kappa light chains per day  3) AKI on CKD -could be from cardiorenal syndrome. Cannot rule out possible contribution from his light chain multiple myeloma especially since his involved light chain is more than 1000.  4) Hypokalemia-we will need aggressive replacement.  Likely related to his diuretics. Following with Dr. DanGlori Bickers Cardiology  5) Abnormal LFTs - likely from congestive hepatopathy vs medications. PLAN -Discussed pt labwork today, 12/04/18; blood counts are stable -Discussed the 11/18/18  24hour UPEP which revealed 7.225g of total protein per day. Also excreting 5.208g of Free lambda light chains per day. -Patient has developed significant proteinuria which is a marker for AL amyloidosis affecting his kidneys this could make managing third space fluids more difficult. -Follow up with salt restrictions, diuretics, and  electrolyte management per Dr. BenHaroldine Laws Cardiology -The pt has no prohibitive toxicities from continuing C1D8 CyBorD, with 500m52mclophosphamide, and 12mg46mamethasone at this time. -Proceed with PET/CT as previously ordered -Recommend wrapping ankles at home with home health, and leg elevation -Discussed option of port placement which pt will consider -Recommend Miralax daily and Senna S -Continue Acyclovir BID for shingles prophylaxis -monitoring abnormal LFTs, minimize hepatotoxic medications. -Will see the pt back in 2 weeks   Please schedule remaining C1 and C2 of CyBorD treatment Weekly labs RTC with Dr Osiris Odriscoll Irene Limbo labs in 2 weeks C1D22   All of the patients questions were answered with apparent satisfaction. The patient knows to call the clinic with any problems, questions or concerns.  The total time spent in the appt was 25 minutes and more than 50% was on counseling and direct patient cares.    GautaSullivan LoneS AAHIVMS SCH CMayaguez Medical CenterHEast [facility] LPtology/Oncology Physician [facility] LLCfice):       [phone] cell):  [phone]           [phone] 9:44 AM  I, SchuyBaldwin Jamaicaacting as a scribe for Dr. GautaSullivan LoneI have reviewed the above documentation for accuracy and completeness, and I agree with the above. .GautBrunetta Genera

## 2018-12-04 ENCOUNTER — Inpatient Hospital Stay: Payer: PPO

## 2018-12-04 ENCOUNTER — Inpatient Hospital Stay (HOSPITAL_BASED_OUTPATIENT_CLINIC_OR_DEPARTMENT_OTHER): Payer: PPO | Admitting: Hematology

## 2018-12-04 ENCOUNTER — Telehealth: Payer: Self-pay | Admitting: Hematology

## 2018-12-04 ENCOUNTER — Other Ambulatory Visit: Payer: Self-pay

## 2018-12-04 ENCOUNTER — Other Ambulatory Visit: Payer: Self-pay | Admitting: *Deleted

## 2018-12-04 VITALS — BP 84/63 | HR 84 | Temp 98.2°F | Resp 18 | Ht 69.0 in | Wt 181.0 lb

## 2018-12-04 VITALS — BP 102/67

## 2018-12-04 DIAGNOSIS — E854 Organ-limited amyloidosis: Secondary | ICD-10-CM | POA: Diagnosis not present

## 2018-12-04 DIAGNOSIS — E876 Hypokalemia: Secondary | ICD-10-CM | POA: Diagnosis not present

## 2018-12-04 DIAGNOSIS — C9000 Multiple myeloma not having achieved remission: Secondary | ICD-10-CM

## 2018-12-04 DIAGNOSIS — Z5111 Encounter for antineoplastic chemotherapy: Secondary | ICD-10-CM

## 2018-12-04 DIAGNOSIS — Z7189 Other specified counseling: Secondary | ICD-10-CM

## 2018-12-04 DIAGNOSIS — K219 Gastro-esophageal reflux disease without esophagitis: Secondary | ICD-10-CM | POA: Diagnosis not present

## 2018-12-04 DIAGNOSIS — I130 Hypertensive heart and chronic kidney disease with heart failure and stage 1 through stage 4 chronic kidney disease, or unspecified chronic kidney disease: Secondary | ICD-10-CM | POA: Diagnosis not present

## 2018-12-04 DIAGNOSIS — R945 Abnormal results of liver function studies: Secondary | ICD-10-CM | POA: Diagnosis not present

## 2018-12-04 DIAGNOSIS — I43 Cardiomyopathy in diseases classified elsewhere: Secondary | ICD-10-CM | POA: Diagnosis not present

## 2018-12-04 DIAGNOSIS — E8581 Light chain (AL) amyloidosis: Secondary | ICD-10-CM

## 2018-12-04 DIAGNOSIS — J90 Pleural effusion, not elsewhere classified: Secondary | ICD-10-CM

## 2018-12-04 DIAGNOSIS — E781 Pure hyperglyceridemia: Secondary | ICD-10-CM

## 2018-12-04 DIAGNOSIS — N179 Acute kidney failure, unspecified: Secondary | ICD-10-CM | POA: Diagnosis not present

## 2018-12-04 DIAGNOSIS — I425 Other restrictive cardiomyopathy: Secondary | ICD-10-CM

## 2018-12-04 DIAGNOSIS — I5032 Chronic diastolic (congestive) heart failure: Secondary | ICD-10-CM

## 2018-12-04 DIAGNOSIS — R0609 Other forms of dyspnea: Secondary | ICD-10-CM

## 2018-12-04 DIAGNOSIS — E785 Hyperlipidemia, unspecified: Secondary | ICD-10-CM

## 2018-12-04 DIAGNOSIS — M1990 Unspecified osteoarthritis, unspecified site: Secondary | ICD-10-CM | POA: Diagnosis not present

## 2018-12-04 DIAGNOSIS — Z87891 Personal history of nicotine dependence: Secondary | ICD-10-CM

## 2018-12-04 DIAGNOSIS — Z7982 Long term (current) use of aspirin: Secondary | ICD-10-CM

## 2018-12-04 DIAGNOSIS — N049 Nephrotic syndrome with unspecified morphologic changes: Secondary | ICD-10-CM

## 2018-12-04 DIAGNOSIS — Z79899 Other long term (current) drug therapy: Secondary | ICD-10-CM

## 2018-12-04 LAB — CMP (CANCER CENTER ONLY)
ALT: 110 U/L — ABNORMAL HIGH (ref 0–44)
AST: 45 U/L — ABNORMAL HIGH (ref 15–41)
Albumin: 3.4 g/dL — ABNORMAL LOW (ref 3.5–5.0)
Alkaline Phosphatase: 222 U/L — ABNORMAL HIGH (ref 38–126)
Anion gap: 15 (ref 5–15)
BUN: 76 mg/dL — ABNORMAL HIGH (ref 8–23)
CO2: 33 mmol/L — ABNORMAL HIGH (ref 22–32)
Calcium: 8.9 mg/dL (ref 8.9–10.3)
Chloride: 88 mmol/L — ABNORMAL LOW (ref 98–111)
Creatinine: 2.27 mg/dL — ABNORMAL HIGH (ref 0.61–1.24)
GFR, Est AFR Am: 32 mL/min — ABNORMAL LOW (ref >60)
GFR, Estimated: 28 mL/min — ABNORMAL LOW (ref >60)
Glucose, Bld: 187 mg/dL — ABNORMAL HIGH (ref 70–99)
Potassium: 3.5 mmol/L (ref 3.5–5.1)
Sodium: 136 mmol/L (ref 135–145)
Total Bilirubin: 0.8 mg/dL (ref 0.3–1.2)
Total Protein: 6.3 g/dL — ABNORMAL LOW (ref 6.5–8.1)

## 2018-12-04 LAB — CBC WITH DIFFERENTIAL (CANCER CENTER ONLY)
Abs Immature Granulocytes: 0.08 10*3/uL — ABNORMAL HIGH (ref 0.00–0.07)
Basophils Absolute: 0 10*3/uL (ref 0.0–0.1)
Basophils Relative: 0 %
Eosinophils Absolute: 0.1 10*3/uL (ref 0.0–0.5)
Eosinophils Relative: 1 %
HCT: 36.4 % — ABNORMAL LOW (ref 39.0–52.0)
Hemoglobin: 12.3 g/dL — ABNORMAL LOW (ref 13.0–17.0)
Immature Granulocytes: 1 %
Lymphocytes Relative: 7 %
Lymphs Abs: 0.9 10*3/uL (ref 0.7–4.0)
MCH: 32.7 pg (ref 26.0–34.0)
MCHC: 33.8 g/dL (ref 30.0–36.0)
MCV: 96.8 fL (ref 80.0–100.0)
Monocytes Absolute: 0.9 10*3/uL (ref 0.1–1.0)
Monocytes Relative: 7 %
Neutro Abs: 11.3 10*3/uL — ABNORMAL HIGH (ref 1.7–7.7)
Neutrophils Relative %: 84 %
Platelet Count: 260 10*3/uL (ref 150–400)
RBC: 3.76 MIL/uL — ABNORMAL LOW (ref 4.22–5.81)
RDW: 12.4 % (ref 11.5–15.5)
WBC Count: 13.2 10*3/uL — ABNORMAL HIGH (ref 4.0–10.5)
nRBC: 0.5 % — ABNORMAL HIGH (ref 0.0–0.2)

## 2018-12-04 MED ORDER — SODIUM CHLORIDE 0.9 % IV SOLN
500.0000 mg | Freq: Once | INTRAVENOUS | Status: AC
  Administered 2018-12-04: 12:00:00 500 mg via INTRAVENOUS
  Filled 2018-12-04: qty 25

## 2018-12-04 MED ORDER — DEXAMETHASONE 4 MG PO TABS
12.0000 mg | ORAL_TABLET | Freq: Once | ORAL | Status: AC
  Administered 2018-12-04: 11:00:00 12 mg via ORAL

## 2018-12-04 MED ORDER — PALONOSETRON HCL INJECTION 0.25 MG/5ML
INTRAVENOUS | Status: AC
  Filled 2018-12-04: qty 5

## 2018-12-04 MED ORDER — PALONOSETRON HCL INJECTION 0.25 MG/5ML
0.2500 mg | Freq: Once | INTRAVENOUS | Status: AC
  Administered 2018-12-04: 11:00:00 0.25 mg via INTRAVENOUS

## 2018-12-04 MED ORDER — DEXAMETHASONE 4 MG PO TABS
ORAL_TABLET | ORAL | Status: AC
  Filled 2018-12-04: qty 3

## 2018-12-04 MED ORDER — SODIUM CHLORIDE 0.9 % IV SOLN
Freq: Once | INTRAVENOUS | Status: AC
  Administered 2018-12-04: 11:00:00 via INTRAVENOUS
  Filled 2018-12-04: qty 250

## 2018-12-04 MED ORDER — BORTEZOMIB CHEMO SQ INJECTION 3.5 MG (2.5MG/ML)
1.5000 mg/m2 | Freq: Once | INTRAMUSCULAR | Status: AC
  Administered 2018-12-04: 3 mg via SUBCUTANEOUS
  Filled 2018-12-04: qty 1.2

## 2018-12-04 NOTE — Telephone Encounter (Signed)
Per 6/11 los, appts already scheduled.

## 2018-12-04 NOTE — Patient Instructions (Signed)
[facility] Discharge Instructions for Patients Receiving Chemotherapy  Today you received the following chemotherapy agents: Velcade, Cytoxan   To help prevent nausea and vomiting after your treatment, we encourage you to take your nausea medication as directed.    If you develop nausea and vomiting that is not controlled by your nausea medication, call the clinic.   BELOW ARE SYMPTOMS THAT SHOULD BE REPORTED IMMEDIATELY:  *FEVER GREATER THAN 100.5 F  *CHILLS WITH OR WITHOUT FEVER  NAUSEA AND VOMITING THAT IS NOT CONTROLLED WITH YOUR NAUSEA MEDICATION  *UNUSUAL SHORTNESS OF BREATH  *UNUSUAL BRUISING OR BLEEDING  TENDERNESS IN MOUTH AND THROAT WITH OR WITHOUT PRESENCE OF ULCERS  *URINARY PROBLEMS  *BOWEL PROBLEMS  UNUSUAL RASH Items with * indicate a potential emergency and should be followed up as soon as possible.  Feel free to call the clinic should you have any questions or concerns. The clinic phone number is [phone].  Please show the CHEMO ALERT CARD at check-in to the Emergency Department and triage nurse.   

## 2018-12-04 NOTE — Progress Notes (Signed)
Per Dr. Irene Limbo -ok to treat with Cr 2.27 and ALT 110

## 2018-12-05 DIAGNOSIS — I5033 Acute on chronic diastolic (congestive) heart failure: Secondary | ICD-10-CM | POA: Diagnosis not present

## 2018-12-05 DIAGNOSIS — J9611 Chronic respiratory failure with hypoxia: Secondary | ICD-10-CM | POA: Diagnosis not present

## 2018-12-05 DIAGNOSIS — N183 Chronic kidney disease, stage 3 (moderate): Secondary | ICD-10-CM | POA: Diagnosis not present

## 2018-12-05 DIAGNOSIS — I130 Hypertensive heart and chronic kidney disease with heart failure and stage 1 through stage 4 chronic kidney disease, or unspecified chronic kidney disease: Secondary | ICD-10-CM | POA: Diagnosis not present

## 2018-12-07 DIAGNOSIS — I5032 Chronic diastolic (congestive) heart failure: Secondary | ICD-10-CM | POA: Diagnosis not present

## 2018-12-09 DIAGNOSIS — M1991 Primary osteoarthritis, unspecified site: Secondary | ICD-10-CM | POA: Diagnosis not present

## 2018-12-09 DIAGNOSIS — Z9981 Dependence on supplemental oxygen: Secondary | ICD-10-CM | POA: Diagnosis not present

## 2018-12-09 DIAGNOSIS — E782 Mixed hyperlipidemia: Secondary | ICD-10-CM | POA: Diagnosis not present

## 2018-12-09 DIAGNOSIS — N183 Chronic kidney disease, stage 3 (moderate): Secondary | ICD-10-CM | POA: Diagnosis not present

## 2018-12-09 DIAGNOSIS — C9000 Multiple myeloma not having achieved remission: Secondary | ICD-10-CM | POA: Diagnosis not present

## 2018-12-09 DIAGNOSIS — I5033 Acute on chronic diastolic (congestive) heart failure: Secondary | ICD-10-CM | POA: Diagnosis not present

## 2018-12-09 DIAGNOSIS — K219 Gastro-esophageal reflux disease without esophagitis: Secondary | ICD-10-CM | POA: Diagnosis not present

## 2018-12-09 DIAGNOSIS — I130 Hypertensive heart and chronic kidney disease with heart failure and stage 1 through stage 4 chronic kidney disease, or unspecified chronic kidney disease: Secondary | ICD-10-CM | POA: Diagnosis not present

## 2018-12-09 DIAGNOSIS — Z87891 Personal history of nicotine dependence: Secondary | ICD-10-CM | POA: Diagnosis not present

## 2018-12-09 DIAGNOSIS — I425 Other restrictive cardiomyopathy: Secondary | ICD-10-CM | POA: Diagnosis not present

## 2018-12-09 DIAGNOSIS — I5020 Unspecified systolic (congestive) heart failure: Secondary | ICD-10-CM | POA: Diagnosis not present

## 2018-12-09 DIAGNOSIS — J9611 Chronic respiratory failure with hypoxia: Secondary | ICD-10-CM | POA: Diagnosis not present

## 2018-12-10 ENCOUNTER — Ambulatory Visit (HOSPITAL_COMMUNITY)
Admission: RE | Admit: 2018-12-10 | Discharge: 2018-12-10 | Disposition: A | Discharge status: Home / Self Care | Payer: PPO | Source: Ambulatory Visit | Attending: Hematology | Admitting: Hematology

## 2018-12-10 ENCOUNTER — Other Ambulatory Visit: Payer: Self-pay | Admitting: *Deleted

## 2018-12-10 ENCOUNTER — Other Ambulatory Visit: Payer: Self-pay

## 2018-12-10 ENCOUNTER — Other Ambulatory Visit: Payer: Self-pay | Admitting: Oncology

## 2018-12-10 DIAGNOSIS — J90 Pleural effusion, not elsewhere classified: Secondary | ICD-10-CM | POA: Diagnosis not present

## 2018-12-10 DIAGNOSIS — C9000 Multiple myeloma not having achieved remission: Secondary | ICD-10-CM

## 2018-12-10 DIAGNOSIS — R918 Other nonspecific abnormal finding of lung field: Secondary | ICD-10-CM | POA: Diagnosis not present

## 2018-12-10 DIAGNOSIS — I2510 Atherosclerotic heart disease of native coronary artery without angina pectoris: Secondary | ICD-10-CM | POA: Diagnosis not present

## 2018-12-10 DIAGNOSIS — I700 Atherosclerosis of aorta: Secondary | ICD-10-CM | POA: Diagnosis not present

## 2018-12-10 DIAGNOSIS — E8581 Light chain (AL) amyloidosis: Secondary | ICD-10-CM | POA: Insufficient documentation

## 2018-12-10 LAB — GLUCOSE, CAPILLARY: Glucose-Capillary: 115 mg/dL — ABNORMAL HIGH (ref 70–99)

## 2018-12-10 MED ORDER — FLUDEOXYGLUCOSE F - 18 (FDG) INJECTION: 8.5000 | Freq: Once | INTRAVENOUS | Status: DC | PRN

## 2018-12-10 MED ORDER — CIPROFLOXACIN HCL 250 MG PO TABS: 250.0000 mg | ORAL_TABLET | Freq: Two times a day (BID) | ORAL | 0 refills | Status: AC

## 2018-12-10 NOTE — Progress Notes (Signed)
I was called by Dr. Clovis Riley at 5:20 PM regarding results of the PET scan which show evidence of pneumonia.  I contacted Mr. Adam Lewis.  He tells me he has no fever no cough and generally feels well.  I thought the better part of valor, even though he is not neutropenic, given that he is severely immunocompromised, is to start antibiotics, and I wrote for Cipro 250 mg to take twice daily.  The patient is coming here tomorrow for treatment.  I will alert Dr. Jenell Milliner regarding these developments.

## 2018-12-11 ENCOUNTER — Inpatient Hospital Stay (HOSPITAL_BASED_OUTPATIENT_CLINIC_OR_DEPARTMENT_OTHER): Payer: PPO | Admitting: Hematology

## 2018-12-11 ENCOUNTER — Inpatient Hospital Stay: Payer: PPO

## 2018-12-11 ENCOUNTER — Other Ambulatory Visit: Payer: Self-pay

## 2018-12-11 ENCOUNTER — Telehealth: Payer: Self-pay | Admitting: *Deleted

## 2018-12-11 ENCOUNTER — Encounter: Payer: Self-pay | Admitting: Hematology

## 2018-12-11 VITALS — BP 87/60 | HR 95 | Temp 98.3°F | Resp 18 | Ht 69.0 in | Wt 175.2 lb

## 2018-12-11 DIAGNOSIS — I130 Hypertensive heart and chronic kidney disease with heart failure and stage 1 through stage 4 chronic kidney disease, or unspecified chronic kidney disease: Secondary | ICD-10-CM | POA: Diagnosis not present

## 2018-12-11 DIAGNOSIS — M1990 Unspecified osteoarthritis, unspecified site: Secondary | ICD-10-CM | POA: Diagnosis not present

## 2018-12-11 DIAGNOSIS — C9000 Multiple myeloma not having achieved remission: Secondary | ICD-10-CM

## 2018-12-11 DIAGNOSIS — E8581 Light chain (AL) amyloidosis: Secondary | ICD-10-CM

## 2018-12-11 DIAGNOSIS — N179 Acute kidney failure, unspecified: Secondary | ICD-10-CM

## 2018-12-11 DIAGNOSIS — I43 Cardiomyopathy in diseases classified elsewhere: Secondary | ICD-10-CM | POA: Diagnosis not present

## 2018-12-11 DIAGNOSIS — I700 Atherosclerosis of aorta: Secondary | ICD-10-CM

## 2018-12-11 DIAGNOSIS — R945 Abnormal results of liver function studies: Secondary | ICD-10-CM | POA: Diagnosis not present

## 2018-12-11 DIAGNOSIS — E781 Pure hyperglyceridemia: Secondary | ICD-10-CM

## 2018-12-11 DIAGNOSIS — E876 Hypokalemia: Secondary | ICD-10-CM | POA: Diagnosis not present

## 2018-12-11 DIAGNOSIS — E854 Organ-limited amyloidosis: Secondary | ICD-10-CM

## 2018-12-11 DIAGNOSIS — I425 Other restrictive cardiomyopathy: Secondary | ICD-10-CM

## 2018-12-11 DIAGNOSIS — Z87891 Personal history of nicotine dependence: Secondary | ICD-10-CM

## 2018-12-11 DIAGNOSIS — I5032 Chronic diastolic (congestive) heart failure: Secondary | ICD-10-CM

## 2018-12-11 DIAGNOSIS — K219 Gastro-esophageal reflux disease without esophagitis: Secondary | ICD-10-CM | POA: Diagnosis not present

## 2018-12-11 DIAGNOSIS — N049 Nephrotic syndrome with unspecified morphologic changes: Secondary | ICD-10-CM

## 2018-12-11 DIAGNOSIS — Z7189 Other specified counseling: Secondary | ICD-10-CM

## 2018-12-11 DIAGNOSIS — Z7982 Long term (current) use of aspirin: Secondary | ICD-10-CM

## 2018-12-11 DIAGNOSIS — Z5111 Encounter for antineoplastic chemotherapy: Secondary | ICD-10-CM | POA: Diagnosis not present

## 2018-12-11 DIAGNOSIS — J90 Pleural effusion, not elsewhere classified: Secondary | ICD-10-CM

## 2018-12-11 DIAGNOSIS — R0609 Other forms of dyspnea: Secondary | ICD-10-CM

## 2018-12-11 DIAGNOSIS — K5900 Constipation, unspecified: Secondary | ICD-10-CM

## 2018-12-11 DIAGNOSIS — E785 Hyperlipidemia, unspecified: Secondary | ICD-10-CM

## 2018-12-11 DIAGNOSIS — Z79899 Other long term (current) drug therapy: Secondary | ICD-10-CM

## 2018-12-11 LAB — CBC WITH DIFFERENTIAL (CANCER CENTER ONLY)
Abs Immature Granulocytes: 0.04 10*3/uL (ref 0.00–0.07)
Basophils Absolute: 0 10*3/uL (ref 0.0–0.1)
Basophils Relative: 0 %
Eosinophils Absolute: 0.2 10*3/uL (ref 0.0–0.5)
Eosinophils Relative: 1 %
HCT: 40.8 % (ref 39.0–52.0)
Hemoglobin: 13.4 g/dL (ref 13.0–17.0)
Immature Granulocytes: 0 %
Lymphocytes Relative: 3 %
Lymphs Abs: 0.4 10*3/uL — ABNORMAL LOW (ref 0.7–4.0)
MCH: 33.1 pg (ref 26.0–34.0)
MCHC: 32.8 g/dL (ref 30.0–36.0)
MCV: 100.7 fL — ABNORMAL HIGH (ref 80.0–100.0)
Monocytes Absolute: 0.8 10*3/uL (ref 0.1–1.0)
Monocytes Relative: 6 %
Neutro Abs: 11.8 10*3/uL — ABNORMAL HIGH (ref 1.7–7.7)
Neutrophils Relative %: 90 %
Platelet Count: 164 10*3/uL (ref 150–400)
RBC: 4.05 MIL/uL — ABNORMAL LOW (ref 4.22–5.81)
RDW: 13.7 % (ref 11.5–15.5)
WBC Count: 13.2 10*3/uL — ABNORMAL HIGH (ref 4.0–10.5)
nRBC: 0.7 % — ABNORMAL HIGH (ref 0.0–0.2)

## 2018-12-11 LAB — CMP (CANCER CENTER ONLY)
ALT: 37 U/L (ref 0–44)
AST: 25 U/L (ref 15–41)
Albumin: 3.6 g/dL (ref 3.5–5.0)
Alkaline Phosphatase: 142 U/L — ABNORMAL HIGH (ref 38–126)
Anion gap: 17 — ABNORMAL HIGH (ref 5–15)
BUN: 84 mg/dL — ABNORMAL HIGH (ref 8–23)
CO2: 33 mmol/L — ABNORMAL HIGH (ref 22–32)
Calcium: 9.5 mg/dL (ref 8.9–10.3)
Chloride: 88 mmol/L — ABNORMAL LOW (ref 98–111)
Creatinine: 2.15 mg/dL — ABNORMAL HIGH (ref 0.61–1.24)
GFR, Est AFR Am: 34 mL/min — ABNORMAL LOW (ref >60)
GFR, Estimated: 30 mL/min — ABNORMAL LOW (ref >60)
Glucose, Bld: 139 mg/dL — ABNORMAL HIGH (ref 70–99)
Potassium: 4.1 mmol/L (ref 3.5–5.1)
Sodium: 138 mmol/L (ref 135–145)
Total Bilirubin: 1.3 mg/dL — ABNORMAL HIGH (ref 0.3–1.2)
Total Protein: 6.7 g/dL (ref 6.5–8.1)

## 2018-12-11 MED ORDER — PALONOSETRON HCL INJECTION 0.25 MG/5ML
INTRAVENOUS | Status: AC
  Filled 2018-12-11: qty 5

## 2018-12-11 MED ORDER — DEXAMETHASONE 4 MG PO TABS
12.0000 mg | ORAL_TABLET | Freq: Once | ORAL | Status: AC
  Administered 2018-12-11: 16:00:00 12 mg via ORAL

## 2018-12-11 MED ORDER — DEXAMETHASONE 4 MG PO TABS
ORAL_TABLET | ORAL | Status: AC
  Filled 2018-12-11: qty 3

## 2018-12-11 MED ORDER — SODIUM CHLORIDE 0.9 % IV SOLN
500.0000 mg | Freq: Once | INTRAVENOUS | Status: AC
  Administered 2018-12-11: 17:00:00 500 mg via INTRAVENOUS
  Filled 2018-12-11: qty 25

## 2018-12-11 MED ORDER — DEXAMETHASONE SODIUM PHOSPHATE 10 MG/ML IJ SOLN
INTRAMUSCULAR | Status: AC
  Filled 2018-12-11: qty 1

## 2018-12-11 MED ORDER — MAGNESIUM CITRATE PO SOLN: 0.5000 | Freq: Once | ORAL | 0 refills | Status: AC

## 2018-12-11 MED ORDER — SODIUM CHLORIDE 0.9 % IV SOLN
Freq: Once | INTRAVENOUS | Status: AC
  Administered 2018-12-11: 17:00:00 via INTRAVENOUS
  Filled 2018-12-11: qty 250

## 2018-12-11 MED ORDER — PALONOSETRON HCL INJECTION 0.25 MG/5ML
0.2500 mg | Freq: Once | INTRAVENOUS | Status: AC
  Administered 2018-12-11: 17:00:00 0.25 mg via INTRAVENOUS

## 2018-12-11 MED ORDER — BORTEZOMIB CHEMO SQ INJECTION 3.5 MG (2.5MG/ML)
1.5000 mg/m2 | Freq: Once | INTRAMUSCULAR | Status: AC
  Administered 2018-12-11: 17:00:00 3 mg via SUBCUTANEOUS
  Filled 2018-12-11: qty 1.2

## 2018-12-11 MED ORDER — SENNOSIDES-DOCUSATE SODIUM 8.6-50 MG PO TABS: 2.0000 | ORAL_TABLET | Freq: Every day | ORAL | 1 refills | Status: AC

## 2018-12-11 NOTE — Telephone Encounter (Signed)
MD would like to see patient today at 3pm. Patient has lab appt at 2:15 and Infusion appt at 3:15, so will be seen between them.  Attempted to contact patient at 1:40 pm with this information, left voice mail for him. Will also contact patient once he arrives to [facility].

## 2018-12-11 NOTE — Patient Instructions (Signed)
[facility] Discharge Instructions for Patients Receiving Chemotherapy  Today you received the following chemotherapy agents: Velcade, Cytoxan   To help prevent nausea and vomiting after your treatment, we encourage you to take your nausea medication as directed.    If you develop nausea and vomiting that is not controlled by your nausea medication, call the clinic.   BELOW ARE SYMPTOMS THAT SHOULD BE REPORTED IMMEDIATELY:  *FEVER GREATER THAN 100.5 F  *CHILLS WITH OR WITHOUT FEVER  NAUSEA AND VOMITING THAT IS NOT CONTROLLED WITH YOUR NAUSEA MEDICATION  *UNUSUAL SHORTNESS OF BREATH  *UNUSUAL BRUISING OR BLEEDING  TENDERNESS IN MOUTH AND THROAT WITH OR WITHOUT PRESENCE OF ULCERS  *URINARY PROBLEMS  *BOWEL PROBLEMS  UNUSUAL RASH Items with * indicate a potential emergency and should be followed up as soon as possible.  Feel free to call the clinic should you have any questions or concerns. The clinic phone number is [phone].  Please show the CHEMO ALERT CARD at check-in to the Emergency Department and triage nurse.   

## 2018-12-11 NOTE — Progress Notes (Signed)
HEMATOLOGY/ONCOLOGY CLINIC NOTE  Date of Service: 12/11/2018  Patient Care Team: Redmond School, MD as PCP - General (Internal Medicine) Satira Sark, MD as PCP - Cardiology (Cardiology) Bensimhon, Shaune Pascal, MD as PCP - Advanced Heart Failure (Cardiology)  CHIEF COMPLAINTS/PURPOSE OF CONSULTATION:  Plasma Cell Dyscrasia  HISTORY OF PRESENTING ILLNESS:  Adam Lewis is a wonderful 73 y.o. 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 He is currently admitted with decompensated heart failure.  Patient has a history of hypertension, chronic diastolic heart failure with restrictive cardiomyopathy with dyspnea on exertion, GERD, dyslipidemia and osteoarthritis who reports progressively worsening dyspnea on exertion and shortness of breath over the last 6 to 8 months. Patient has been following up with cardiology. Echocardiogram done on 01/2018 showed EF 55-60% with moderate LVH restrictive physiology, RVSP 38 mmHg.  He subsequently had cardiac catheterization in August 2019 which showed normal coronaries. Cardiac MRI done on 08/14/2018 showed1. Septal hypertrophy normal LV size mild diffuse hypokinesis EF 49% 2. Markedly abnormal delayed gadolinium images with severe diffuse myocardial uptake suggesting severe infiltrative cardiomyopathy. Consider biopsy and genetic testing  Patient was subsequently referred to [facility] and had an endomyocardial biopsy on 09/04/2018 which showed  Endomyocardial biopsy; diagnostic biopsy:  Cardiac amyloidosis, mild infiltrative and nodular pattern - See comment Mild myocyte hypertrophy with mild to moderate lipofuscinosis No evidence of myocarditis or granulomatous inflammation Comment:  The biopsy tissue demonstrates several foci of myocyte dropout with replacement by a pale waxy material that suggests amyloid but has some atypical  staining. This likely reflects preservation/processing issue.  Deeper sections no longer contain these foci, but some additional areas with similar interstitial material  are present.  The section for Congo Red stain does not contain any of these foci; however, there is at least on focus of slight interstitial expansion by Congophilic material that exhibits typical birefringence under polarization.  Electron microscopic confirmation will be attempted and expedited. Additionally, the remaining tissue block will be sent for amyloid mass spectrometry. The case has been reviewed with Dr. Tonye Royalty, also of cardiovascular pathology, who is in agreement with the diagnosis.  Apparently cardiac endomyocardial biopsy sample was inadequate for more spectrometric analysis to determine the type of amyloid.  Lab testing on 09/25/2018 for serum free light chains showed significantly elevated lambda free light chains at 2293.1 with normal kappa free light chains with significantly normal ratio. Urine random immunofixation electrophoresis showed Bence-Jones protein lambda type.  Patient subsequently had a CT-guided bone marrow biopsy on 10/24/2018 which showed evidence of plasma cell myeloma with lambda restricted plasma cells at 17% by aspirate and 30 to 40% based on CD 138 immunohistochemistry. Congo red was negative for amyloid on the bone marrow biopsy.  Patient is currently admitted decompensation of his restrictive cardiomyopathy due to amyloidosis with difficult to manage volume status and acute kidney injury. He is currently on a Levophed and Lasix drip with good diuresis and notes that his shortness of breath is improved.  He notes that until summer of last year he was very active and walked his pet regularly several times a day.  Since fall of last year he has noted worsening shortness of breath and dyspnea on exertion. Which is progressively gotten worse.  He just had a discussion with Dr. Haroldine Laws  regarding the concerning prognosis from a cardiology standpoint.  He is concerned about what this means  for his wife and family.  We spent a significant bit of time talking about his diagnosis of plasma cell dyscrasia with AL amyloidosis and additional diagnostics including a 24-hour urine, labs, outpatient PET CT scan to rule out bone lesions and natural history and treatment approaches.  He realizes in the current focus of treatment is to get his heart failure under better control.  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 The patient's last visit with [place] was on 12/04/18. The pt reports that he is doing well overall.  The pt reports that he has not had any cough, fevers, fatigue, phlegm production or changes in breathing. His PET/CT yesterday showed possible pneumonia and he was prescribed a seven day course of 244m Cipro BID which he began yesterday, 12/10/18. The pt notes that he feels at his baseline currently. The pt notes that he feels that he is tolerating the treatment well so far.   He notes that his biggest complaint is that he hasn't had a bowel movement in the last week since his last infusion, though he notes that he is eating well. He denies abdominal pain as such but feels "uncomfortable." He has tried 3 days of metamucil, warmed prune juice, warm water syringe, and green tea. The pt notes that he had a very good bowel movement after his last treatment.  He adds that he needs to strain to begin urinating. He notes that his leg swelling has improved greatly. He feels that his breathing is stable and he continues on O2.  Of note since the patient's last visit, pt has had a PET/CT completed on 12/10/18 with results revealing "No abnormal focus of increased uptake to suggest metabolically active lesion of myeloma. No hypermetabolic adenopathy or mass identified to suggest plasmacytoma. 2. Multi  chamber cardiac enlargement, bilateral pleural effusions and pulmonary interstitial edema compatible with CHF. There is diffuse edema identified throughout the abdomen and pelvis as well as anasarca. 3. FDG avid airspace consolidation within the anterior basal right upper lobe and right middle lobe concerning for infection. 4. Aortic Atherosclerosis. Coronary artery calcifications."  Lab results today (12/11/18) of CBC w/diff and CMP is as follows: all values are WNL except for WBC at 13.2k, RBC at 4.05, MCV at 100.7, nRBC at 0.7%, ANC at 11.8k, Lymphs abs at 400, Chloride at 88, CO2 at 33, Glucose at 139, BUN at 84, Creatinine at 2.15, Alk Phos at 142, total bilirubin at 1.3, GFR at 30. 12/11/18 SFLC are pending  On review of systems, pt reports stable energy levels, eating well, constipation, improved leg swelling, and denies fevers, chills, cough, phlegm, changes in breathing, SOB, abdominal pain, and any other symptoms.   MEDICAL HISTORY:  Past Medical History:  Diagnosis Date   Essential hypertension    Family history of adverse reaction to anesthesia    father died after OHS in c43 unaware of reason   GERD (gastroesophageal reflux disease)    Hypertriglyceridemia    Osteoarthritis     SURGICAL HISTORY: Past Surgical History:  Procedure Laterality Date   CARPAL TUNNEL RELEASE     COLONOSCOPY  01/02/2011   Procedure: COLONOSCOPY;  Surgeon: MJamesetta So  Location: [facility];  Service: Gastroenterology;  Laterality: N/A;   LEFT HEART CATH AND CORONARY ANGIOGRAPHY N/A 02/18/2018   Procedure: LEFT HEART CATH AND CORONARY ANGIOGRAPHY;  Surgeon: KTroy Sine MD;  [facility];  Service: Cardiovascular;  Laterality: N/A;   LITHOTRIPSY  05/2009   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    SOCIAL HISTORY: Social History   Socioeconomic History   Marital status: Married    Spouse name: Not on file   Number of children: Not on file    Years of education: Not on file   Highest education level: Not on file  Occupational History   Not on file  Social Needs   Financial resource strain: Not on file   Food insecurity    Worry: Not on file    Inability: Not on file   Transportation needs    Medical: Not on file    Non-medical: Not on file  Tobacco Use   Smoking status: Former Smoker    Types: Cigarettes    Quit date: 07/16/1977    Years since quitting: 41.4   Smokeless tobacco: Never Used  Substance and Sexual Activity   Alcohol use: Yes    Alcohol/week: 16.0 standard drinks    Types: 1 Glasses of wine, 14 Cans of beer, 1 Shots of liquor per week   Drug use: No   Sexual activity: Not on file  Lifestyle   Physical activity    Days per week: Not on file    Minutes per session: Not on file   Stress: Not on file  Relationships   Social connections    Talks on phone: Not on file    Gets together: Not on file    Attends religious service: Not on file    Active member of club or organization: Not on file    Attends meetings of clubs or organizations: Not on file    Relationship status: Not on file   Intimate partner violence    Fear of current or ex partner: Not on file    Emotionally abused: Not on file    Physically abused: Not on file    Forced sexual activity: Not on file  Other Topics Concern   Not on file  Social History Narrative   Not on file    FAMILY HISTORY: Family History  Problem Relation Age of Onset   Hypertension Father    Heart disease Father     ALLERGIES:  is allergic to bee venom.  MEDICATIONS:  Current Outpatient Medications  Medication Sig Dispense Refill   acyclovir (ZOVIRAX) 400 MG tablet Take 1 tablet (400 mg total) by mouth daily. 60 tablet 3   allopurinol (ZYLOPRIM) 100 MG tablet Take 100 mg by mouth daily.   3   ALPRAZolam (XANAX) 0.5 MG tablet Take 0.5 mg by mouth 4 (four) times daily as needed for anxiety.     aspirin 81 MG chewable tablet Chew 1  tablet (81 mg total) by mouth daily. 30 tablet 6   ciprofloxacin (CIPRO) 250 MG tablet Take 1 tablet (250 mg total) by mouth 2 (two) times daily. 14 tablet 0   escitalopram (LEXAPRO) 10 MG tablet Take 10 mg by mouth daily.     fish oil-omega-3 fatty acids 1000 MG capsule Take 1 g by mouth daily.      lansoprazole (PREVACID) 30 MG capsule Take 30 mg by mouth daily.       magnesium citrate SOLN Take 148 mLs (0.5 Bottles total) by mouth once for 1 dose. 195 mL 0   metolazone (ZAROXOLYN) 5 MG tablet Take 1 tablet (5 mg total) by mouth every Monday, Wednesday, and Friday. 15 tablet 3   midodrine (PROAMATINE) 5  MG tablet Take 1 tablet (5 mg total) by mouth 3 (three) times daily with meals. 270 tablet 3   nitroGLYCERIN (NITROSTAT) 0.4 MG SL tablet Place 1 tablet (0.4 mg total) under the tongue every 5 (five) minutes as needed for chest pain. (Patient not taking: Reported on 12/04/2018) 25 tablet 3   ondansetron (ZOFRAN) 8 MG tablet Take 1 tablet (8 mg total) by mouth 2 (two) times daily as needed for refractory nausea / vomiting. Starting on days 4 and 11. Do not take on day 8. 30 tablet 1   potassium chloride (K-DUR) 10 MEQ tablet Take 4 tablets (40 mEq total) by mouth 2 (two) times daily. Take extra 68mq with metolazone. 360 tablet 6   pravastatin (PRAVACHOL) 80 MG tablet Take 80 mg by mouth daily.   3   prochlorperazine (COMPAZINE) 10 MG tablet Take 1 tablet (10 mg total) by mouth every 6 (six) hours as needed (Nausea or vomiting). 30 tablet 1   senna-docusate (SENNA S) 8.6-50 MG tablet Take 2 tablets by mouth at bedtime. May take 2 tab po twice daily till you have 1st bowel movement. 60 tablet 1   torsemide (DEMADEX) 20 MG tablet Take 3 tablets (60 mg total) by mouth 2 (two) times daily. 180 tablet 6   No current facility-administered medications for this visit.    Facility-Administered Medications Ordered in Other Visits  Medication Dose Route Frequency Provider Last Rate Last Dose    fludeoxyglucose F - 18 (FDG) injection 8.5 millicurie  8.5 millicurie Intravenous Once PRN SKerby Moors MD        REVIEW OF SYSTEMS:    A 10+ POINT REVIEW OF SYSTEMS WAS OBTAINED including neurology, dermatology, psychiatry, cardiac, respiratory, lymph, extremities, GI, GU, Musculoskeletal, constitutional, breasts, reproductive, HEENT.  All pertinent positives are noted in the HPI.  All others are negative.    PHYSICAL EXAMINATION: ECOG PERFORMANCE STATUS: 2 - Symptomatic, <50% confined to bed  Vitals:   12/11/18 1522  BP: (!) 87/60  Pulse: 95  Resp: 18  Temp: 98.3 F (36.8 C)  SpO2: 99%   Filed Weights   12/11/18 1522  Weight: 175 lb 3.2 oz (79.5 kg)   .Body mass index is 25.87 kg/m.  GENERAL:alert, in no acute distress and comfortable SKIN: no acute rashes, no significant lesions EYES: conjunctiva are pink and non-injected, sclera anicteric OROPHARYNX: MMM, no exudates, no oropharyngeal erythema or ulceration NECK: supple, no JVD LYMPH:  no palpable lymphadenopathy in the cervical, axillary or inguinal regions LUNGS: decreased breaths sounds, more on right than left, 1/3 up posterior chest wall, no rales, no rhonchi HEART: S3 gallop ABDOMEN:  normoactive bowel sounds , non tender, not distended. No palpable hepatosplenomegaly.  Extremity: no pedal edema PSYCH: alert & oriented x 3 with fluent speech NEURO: no focal motor/sensory deficits   LABORATORY DATA:  I have reviewed the data as listed  . CBC Latest Ref Rng & Units 12/11/2018 12/04/2018 11/28/2018  WBC 4.0 - 10.5 K/uL 13.2(H) 13.2(H) 12.0(H)  Hemoglobin 13.0 - 17.0 g/dL 13.4 12.3(L) 11.8(L)  Hematocrit 39.0 - 52.0 % 40.8 36.4(L) 35.8(L)  Platelets 150 - 400 K/uL 164 260 273    . CMP Latest Ref Rng & Units 12/11/2018 12/04/2018 12/01/2018  Glucose 70 - 99 mg/dL 139(H) 187(H) 161(H)  BUN 8 - 23 mg/dL 84(H) 76(H) 97(H)  Creatinine 0.61 - 1.24 mg/dL 2.15(H) 2.27(H) 2.96(H)  Sodium 135 - 145 mmol/L 138 136 133(L)    Potassium 3.5 - 5.1 mmol/L 4.1 3.5  4.5  Chloride 98 - 111 mmol/L 88(L) 88(L) 90(L)  CO2 22 - 32 mmol/L 33(H) 33(H) 25  Calcium 8.9 - 10.3 mg/dL 9.5 8.9 8.9  Total Protein 6.5 - 8.1 g/dL 6.7 6.3(L) -  Total Bilirubin 0.3 - 1.2 mg/dL 1.3(H) 0.8 -  Alkaline Phos 38 - 126 U/L 142(H) 222(H) -  AST 15 - 41 U/L 25 45(H) -  ALT 0 - 44 U/L 37 110(H) -    10/24/18 BM Bx:        RADIOGRAPHIC STUDIES: I have personally reviewed the radiological images as listed and agreed with the findings in the report. Dg Chest 2 View  Result Date: 11/14/2018 CLINICAL DATA:  Hypotension EXAM: CHEST - 2 VIEW COMPARISON:  Chest x-ray dated 07/30/2018 FINDINGS: The cardiac silhouette is mildly enlarged. There are new small bilateral pleural effusions, right worse than left. There are bibasilar airspace opacities favored to represent compressive atelectasis. There is generalized volume overload. No pneumothorax. The pulmonary arteries appears somewhat dilated which can be seen in patients with elevated PA pressures. There is no acute osseous abnormality. IMPRESSION: 1. Small bilateral pleural effusions, right worse than left. These have increased significantly since prior chest x-ray. 2. Borderline enlarged heart.  Generalized volume overload is noted. 3. Bibasilar airspace opacities favored to represent compressive atelectasis. Electronically Signed   By: Constance Holster M.D.   On: 11/14/2018 18:35   Nm Pet Image Initial (pi) Whole Body  Result Date: 12/10/2018 CLINICAL DATA:  Initial treatment strategy for multiple myeloma. EXAM: NUCLEAR MEDICINE PET WHOLE BODY TECHNIQUE: 8.5 mCi F-18 FDG was injected intravenously. Full-ring PET imaging was performed from the skull base to thigh after the radiotracer. CT data was obtained and used for attenuation correction and anatomic localization. Fasting blood glucose: 107 mg/dl COMPARISON:  None. FINDINGS: Mediastinal blood pool activity: SUV max 2.36 HEAD/NECK: No  hypermetabolic activity in the scalp. No hypermetabolic cervical lymph nodes. Incidental CT findings: none CHEST: No hypermetabolic axillary or supraclavicular adenopathy. No hypermetabolic mediastinal or hilar adenopathy. Moderate to large right pleural effusion is identified. Small to moderate left pleural effusion noted. There is dense airspace consolidation within the anterior basal right upper lobe with corresponding increased FDG uptake concerning for pneumonia. Small area of increased FDG uptake corresponding to a area of nodular consolidation is also noted in the right middle lobe. Compressive type atelectasis noted within both lower lobes. Interlobular septal thickening with diffuse ground-glass attenuation noted bilaterally. Incidental CT findings: Multi chamber cardiac enlargement. Aortic atherosclerosis. Multi vessel coronary artery calcifications. ABDOMEN/PELVIS: No abnormal hypermetabolic activity within the liver, pancreas, adrenal glands, or spleen. No hypermetabolic lymph nodes in the abdomen or pelvis. Incidental CT findings: There is a small volume of ascites. Diffuse edema is identified throughout the peritoneum and mesenteric fat. SKELETON: No focal hypermetabolic activity to suggest skeletal metastasis. Incidental CT findings: Diffuse body wall edema/anasarca. EXTREMITIES: No abnormal hypermetabolic activity in the lower extremities. Incidental CT findings: none IMPRESSION: 1. No abnormal focus of increased uptake to suggest metabolically active lesion of myeloma. No hypermetabolic adenopathy or mass identified to suggest plasmacytoma. 2. Multi chamber cardiac enlargement, bilateral pleural effusions and pulmonary interstitial edema compatible with CHF. There is diffuse edema identified throughout the abdomen and pelvis as well as anasarca. 3. FDG avid airspace consolidation within the anterior basal right upper lobe and right middle lobe concerning for infection. 4. Aortic Atherosclerosis  (ICD10-I70.0). Coronary artery calcifications. Electronically Signed   By: Kerby Moors M.D.   On: 12/10/2018 16:47  [place] Ekg Site Rite  Result Date: 11/16/2018 If [facility] image not attached, placement could not be confirmed due to current cardiac rhythm.   ASSESSMENT & PLAN:  73 y.o. 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 Given clinical presentation, time course , cardiac MRI findings and most importantly concurrent findings of Multiple myeloma with Lambda light chain restriction - the diagnosis of AL Amyloidosis would be reasonably made Standard risk genetics.  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 Mol Cy - t(11;14) -- standard risk cytogenetics No anemia, hypercalcemia. ? Renal involvement ? Bone lesions.  07/22/18 MMP did not reveal an M spike 11/18/18 SFLC revealed Kappa light chains at 13.'2mg'$  and Lambda light chains at 3084.'8mg'$  11/18/18 24-hour UPEP revealed 7.225g of total protein per day and 5.208g of Lambda light chains per day, and 18.'65mg'$  of Kappa light chains per day  Patient has developed significant proteinuria which is a marker for AL amyloidosis affecting his kidneys this could make managing third space fluids more difficult.  3) AKI on CKD -could be from cardiorenal syndrome. Cannot rule out possible contribution from his light chain multiple myeloma especially since his involved light chain is more than 1000.  4) Hypokalemia-we will need aggressive replacement.  Likely related to his diuretics. Following with Dr. Glori Bickers in Cardiology  5) Abnormal LFTs - likely from congestive hepatopathy vs  medications.  PLAN -Discussed pt labwork today, 12/11/18; HGB normalized to 13.4, WBC stable at 13.2k, kidney and liver functions improved -12/11/18 SFLC are pending, which will gauge treatment response after two weeks of treatment -Discussed the 12/10/18 PET/CT which revealed "No abnormal focus of increased uptake to suggest metabolically active lesion of myeloma. No hypermetabolic adenopathy or mass identified to suggest plasmacytoma. 2. Multi chamber cardiac enlargement, bilateral pleural effusions and pulmonary interstitial edema compatible with CHF. There is diffuse edema identified throughout the abdomen and pelvis as well as anasarca. 3. FDG avid airspace consolidation within the anterior basal right upper lobe and right middle lobe concerning for infection. 4. Aortic Atherosclerosis. Coronary artery calcifications." -Complete 7 day course of '250mg'$  Cipro BID. Pt is asymptomatic at this time. If he develops fevers or SOB, he will let us or Dr. Haroldine Laws know. -The pt has no prohibitive toxicities from continuing [place] with '500mg'$  Cyclophosphamide and '12mg'$  Dexamethasone at this time. -Ordering one time dose of Magnesium citrate -Begin Senna S every night, back off if diarrhea develops -Pt will let me know in 2 days if he does not achieve a bowel movement -Follow up with salt restrictions, diuretics, and electrolyte management per Dr. Haroldine Laws in Cardiology -Recommend wrapping ankles at home with home health, and leg elevation -Discussed option of port placement which pt will consider -Recommend Miralax daily and Senna S -Continue Acyclovir BID for shingles prophylaxis -monitoring abnormal LFTs, minimize hepatotoxic medications. -Will see the pt back in one week   F/u as per scheduled appointments in 1 week   All of the patients questions were answered with apparent satisfaction. The patient knows to call the clinic with any problems, questions or concerns.  The total time spent in  the appt was 25 minutes and more than 50% was on counseling and direct patient cares.   Sullivan Lone MD MS AAHIVMS [facility] Hematology/Oncology [facility]  (Office):       [phone] (Work cell):  [phone] (Fax):           [phone]  12/11/2018 4:11 PM  I, Baldwin Jamaica, am acting as a scribe for Dr. Sullivan Lone.   .I have reviewed the above documentation for accuracy and completeness, and I agree with the above. Brunetta Genera MD

## 2018-12-11 NOTE — Progress Notes (Signed)
Called pt on 12/09/18 to introduce myself as his Arboriculturist and to discuss copay assistance.  Pt gave me consent to apply in his behalf so I applied to [employer] and he was approved to receive assistance up to $11,000 toassist w/ins premiums and/orqualifying treatment expenseseffective 6/1/20to 11/23/19.  Pt is overqualified for the [employer].  I will give him my card on 12/11/18 for any questions or concerns he may have in the future.

## 2018-12-11 NOTE — Progress Notes (Signed)
Okay to treat today with Cr. 2.15, and BP 87/60,  per Dr. Irene Limbo.

## 2018-12-12 ENCOUNTER — Telehealth: Payer: Self-pay | Admitting: Hematology

## 2018-12-12 LAB — KAPPA/LAMBDA LIGHT CHAINS
Kappa free light chain: 12.9 mg/L (ref 3.3–19.4)
Kappa, lambda light chain ratio: 0 — ABNORMAL LOW (ref 0.26–1.65)
Lambda free light chains: 2672.2 mg/L — ABNORMAL HIGH (ref 5.7–26.3)

## 2018-12-12 NOTE — Telephone Encounter (Signed)
Per 6/18 los F/u as per scheduled appointments in 1 week

## 2018-12-15 ENCOUNTER — Other Ambulatory Visit: Payer: Self-pay | Admitting: Radiology

## 2018-12-16 ENCOUNTER — Other Ambulatory Visit (HOSPITAL_COMMUNITY): Payer: PPO

## 2018-12-16 ENCOUNTER — Ambulatory Visit (HOSPITAL_COMMUNITY): Payer: PPO

## 2018-12-17 ENCOUNTER — Telehealth: Payer: Self-pay | Admitting: *Deleted

## 2018-12-17 ENCOUNTER — Encounter (HOSPITAL_COMMUNITY): Payer: PPO | Admitting: Internal Medicine

## 2018-12-18 ENCOUNTER — Inpatient Hospital Stay: Payer: PPO | Admitting: Hematology

## 2018-12-18 ENCOUNTER — Inpatient Hospital Stay: Payer: PPO

## 2018-12-24 DEATH — deceased

## 2018-12-25 ENCOUNTER — Ambulatory Visit: Payer: PPO

## 2018-12-25 ENCOUNTER — Other Ambulatory Visit: Payer: PPO

## 2019-01-01 ENCOUNTER — Other Ambulatory Visit: Payer: PPO

## 2019-01-01 ENCOUNTER — Ambulatory Visit: Payer: PPO

## 2019-01-08 ENCOUNTER — Other Ambulatory Visit: Payer: PPO

## 2019-01-08 ENCOUNTER — Ambulatory Visit: Payer: PPO

## 2019-02-24 DEATH — deceased

## 2020-02-05 IMAGING — PT NUCLEAR MEDICINE INITIAL (PI) WHOLE BODY PET/CT
1 of 7 series · 4 of 25 positions shown · non-contrast
Comparison: None.

CLINICAL DATA: Initial treatment strategy for multiple myeloma.

EXAM:
NUCLEAR MEDICINE PET WHOLE BODY
TECHNIQUE: 8.5 mCi F-18 FDG was injected intravenously. Full-ring PET imaging
was performed from the skull base to thigh after the radiotracer. CT
data was obtained and used for attenuation correction and anatomic
localization.
Fasting blood glucose: 107 mg/dl

[Series 4: ct wb 5.0 hd_fov · axial · 5.0mm · 1.22mm/px · z∈[-48,+1060]mm · 4 of 461 slices shown]
[im 93/461  soft-tissue]
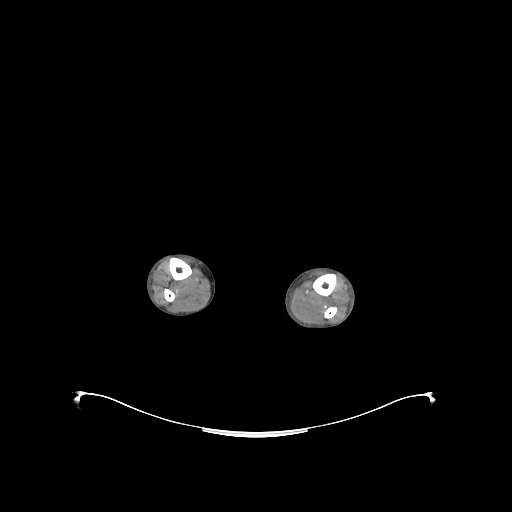
[im 185/461  soft-tissue]
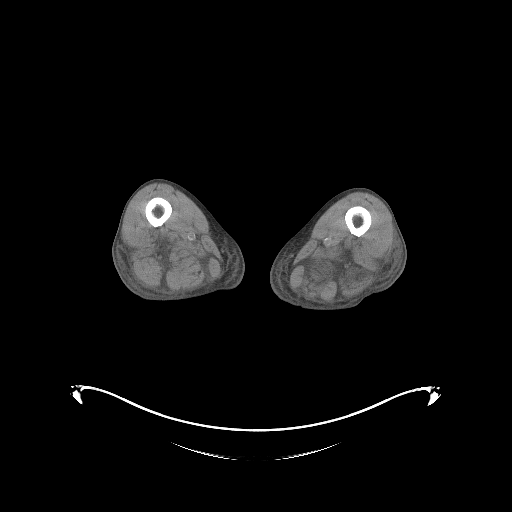
[im 277/461  soft-tissue]
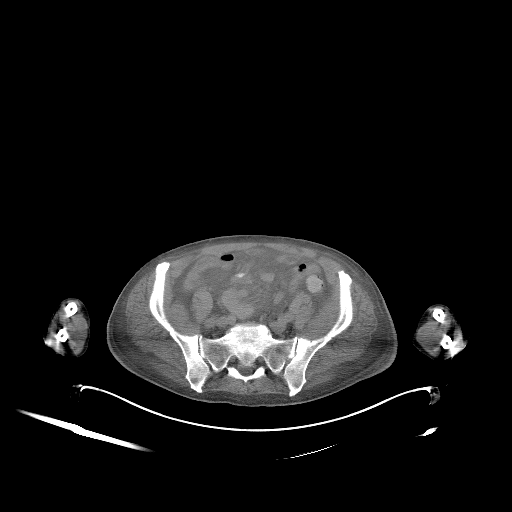
[im 369/461  soft-tissue]
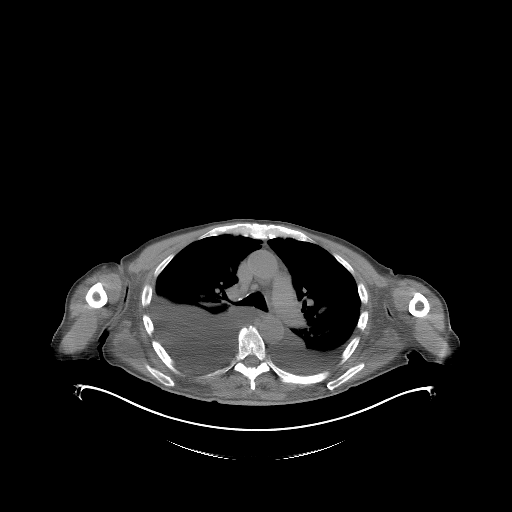

[4 of 25 positions shown; findings below may reference images not displayed]

FINDINGS: Mediastinal blood pool activity: SUV max

HEAD/NECK: No hypermetabolic activity in the scalp. No
hypermetabolic cervical lymph nodes.

Incidental CT findings: none

CHEST: No hypermetabolic axillary or supraclavicular adenopathy. No
hypermetabolic mediastinal or hilar adenopathy. Moderate to large
right pleural effusion is identified. Small to moderate left pleural
effusion noted. There is dense airspace consolidation within the
anterior basal right upper lobe with corresponding increased FDG
uptake concerning for pneumonia. Small area of increased FDG uptake
corresponding to a area of nodular consolidation is also noted in
the right middle lobe. Compressive type atelectasis noted within
both lower lobes. Interlobular septal thickening with diffuse
ground-glass attenuation noted bilaterally.

Incidental CT findings: Multi chamber cardiac enlargement. Aortic
atherosclerosis. Multi vessel coronary artery calcifications.

ABDOMEN/PELVIS: No abnormal hypermetabolic activity within the
liver, pancreas, adrenal glands, or spleen. No hypermetabolic lymph
nodes in the abdomen or pelvis.

Incidental CT findings: There is a small volume of ascites. Diffuse
edema is identified throughout the peritoneum and mesenteric fat.

SKELETON: No focal hypermetabolic activity to suggest skeletal
metastasis.

Incidental CT findings: Diffuse body wall edema/anasarca.

EXTREMITIES: No abnormal hypermetabolic activity in the lower
extremities.

Incidental CT findings: none
IMPRESSION: 1. No abnormal focus of increased uptake to suggest metabolically
active lesion of myeloma. No hypermetabolic adenopathy or mass
identified to suggest plasmacytoma.
2. Multi chamber cardiac enlargement, bilateral pleural effusions
and pulmonary interstitial edema compatible with CHF. There is
diffuse edema identified throughout the abdomen and pelvis as well
as anasarca.
3. FDG avid airspace consolidation within the anterior basal right
upper lobe and right middle lobe concerning for infection.
4. Aortic Atherosclerosis ([id number]). Coronary artery
calcifications.

## 2023-05-16 NOTE — Telephone Encounter (Signed)
Telephone call
